# Patient Record
Sex: Female | Born: 1990 | Race: White | Hispanic: No | Marital: Single | Smoking: Former smoker
Health system: Southern US, Community
[De-identification: ages and names within clinical notes are randomized; demographics above are authoritative.]

## PROBLEM LIST (undated history)

## (undated) DIAGNOSIS — J020 Streptococcal pharyngitis: Secondary | ICD-10-CM

---
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
## 2014-10-16 ENCOUNTER — Encounter: Payer: Self-pay | Admitting: Intensive Care

## 2014-10-16 ENCOUNTER — Emergency Department
Admission: EM | Admit: 2014-10-16 | Discharge: 2014-10-16 | Disposition: A | Discharge status: Home / Self Care | Payer: Medicaid - Out of State | Attending: Emergency Medicine | Admitting: Emergency Medicine

## 2014-10-16 DIAGNOSIS — J029 Acute pharyngitis, unspecified: Secondary | ICD-10-CM | POA: Diagnosis present

## 2014-10-16 DIAGNOSIS — Z720 Tobacco use: Secondary | ICD-10-CM | POA: Insufficient documentation

## 2014-10-16 DIAGNOSIS — J039 Acute tonsillitis, unspecified: Secondary | ICD-10-CM | POA: Insufficient documentation

## 2014-10-16 HISTORY — DX: Streptococcal pharyngitis: J02.0

## 2014-10-16 LAB — POCT RAPID STREP A: STREPTOCOCCUS, GROUP A SCREEN (DIRECT): NEGATIVE

## 2014-10-16 MED ORDER — AMOXICILLIN 500 MG PO CAPS: 500.0000 mg | ORAL_CAPSULE | Freq: Three times a day (TID) | ORAL | Status: DC

## 2014-10-16 NOTE — ED Notes (Signed)
Pt c/o earache and sore throat with white spots in back of throat X 3 days. Pt reports having strep throat before and feels like it is strep throat.

## 2014-10-16 NOTE — ED Provider Notes (Signed)
[facility] Emergency Department Provider Note ____________________________________________  Time seen: 1605  I have reviewed the triage vital signs and the nursing notes.  HISTORY  Chief Complaint  Sore Throat  HPI Eileen Carpenter is a 24 y.o. female ED with 3 days complaint of earache and sore throat. She didn't describe seeing spots in the back of her throat, and describes it feels like strep that she's had before.  Past Medical History  Diagnosis Date  . Strep throat    There are no active problems to display for this patient.  History reviewed. No pertinent past surgical history.  Current Outpatient Rx  Name  Route  Sig  Dispense  Refill  . amoxicillin (AMOXIL) 500 MG capsule   Oral   Take 1 capsule (500 mg total) by mouth 3 (three) times daily.   30 capsule   0    Allergies Motrin  History reviewed. No pertinent family history.  Social History History  Substance Use Topics  . Smoking status: Current Every Day Smoker -- 0.50 packs/day    Types: Cigarettes  . Smokeless tobacco: Never Used  . Alcohol Use: No   Review of Systems  Constitutional: Negative for fever. Eyes: Negative for visual changes. ENT: Positive for sore throat. Cardiovascular: Negative for chest pain. Respiratory: Negative for shortness of breath. Gastrointestinal: Negative for abdominal pain, vomiting and diarrhea. Genitourinary: Negative for dysuria. Musculoskeletal: Negative for back pain. Skin: Negative for rash. Neurological: Negative for headaches, focal weakness or numbness. ____________________________________________  PHYSICAL EXAM:  VITAL SIGNS: ED Triage Vitals  Enc Vitals Group     BP 10/16/14 1616 117/95 mmHg     Pulse Rate 10/16/14 1616 114     Resp 10/16/14 1616 16     Temp 10/16/14 1616 99.9 F (37.7 C)     Temp Source 10/16/14 1616 Oral     SpO2 10/16/14 1616 99 %     Weight 10/16/14 1616 104 lb (47.174 kg)     Height 10/16/14 1616 5\' 1"   (1.549 m)     Head Cir --      Peak Flow --      Pain Score 10/16/14 1617 7     Pain Loc --      Pain Edu? --      Excl. in GC? --    Constitutional: Alert and oriented. Well appearing and in no distress. Eyes: Conjunctivae are normal. PERRL. Normal extraocular movements. ENT   Head: Normocephalic and atraumatic.   Nose: No congestion/rhinnorhea.   Mouth/Throat: Mucous membranes are moist. Tonsils enlarged, injected, with exudate noted.   Neck: Supple. No thyromegaly. Hematological/Lymphatic/Immunilogical: Anterior, upper cervical lymphadenopathy. Cardiovascular: Normal rate, regular rhythm.  Respiratory: Normal respiratory effort. No wheezes/rales/rhonchi. Gastrointestinal: Soft and nontender. No distention. Musculoskeletal: Nontender with normal range of motion in all extremities.  Neurologic:  Normal gait without ataxia. Normal speech and language. No gross focal neurologic deficits are appreciated. Skin:  Skin is warm, dry and intact. No rash noted. Psychiatric: Mood and affect are normal. Patient exhibits appropriate insight and judgment. ____________________________________________   LABS (pertinent positives/negatives) Rapid Strep - negative Throat Culture - pending ____________________________________________  INITIAL IMPRESSION / ASSESSMENT AND PLAN / ED COURSE  Acute tonsillitis. Treatment with amoxicillin. Follow-up with primary provider as needed.  ____________________________________________  FINAL CLINICAL IMPRESSION(S) / ED DIAGNOSES  Final diagnoses:  Acute tonsillitis     Lissa HoardJenise V Bacon Usama Harkless, PA-C 10/16/14 1757  Arnaldo NatalPaul F Malinda, MD 10/17/14 [phone]

## 2014-10-16 NOTE — Discharge Instructions (Signed)
Tonsillitis Tonsillitis is an infection of the throat that causes the tonsils to become red, tender, and swollen. Tonsils are collections of lymphoid tissue at the back of the throat. Each tonsil has crevices (crypts). Tonsils help fight nose and throat infections and keep infection from spreading to other parts of the body for the first 18 months of life.  CAUSES Sudden (acute) tonsillitis is usually caused by infection with streptococcal bacteria. Long-lasting (chronic) tonsillitis occurs when the crypts of the tonsils become filled with pieces of food and bacteria, which makes it easy for the tonsils to become repeatedly infected. SYMPTOMS  Symptoms of tonsillitis include:  A sore throat, with possible difficulty swallowing.  White patches on the tonsils.  Fever.  Tiredness.  New episodes of snoring during sleep, when you did not snore before.  Small, foul-smelling, yellowish-white pieces of material (tonsilloliths) that you occasionally cough up or spit out. The tonsilloliths can also cause you to have bad breath. DIAGNOSIS Tonsillitis can be diagnosed through a physical exam. Diagnosis can be confirmed with the results of lab tests, including a throat culture. TREATMENT  The goals of tonsillitis treatment include the reduction of the severity and duration of symptoms and prevention of associated conditions. Symptoms of tonsillitis can be improved with the use of steroids to reduce the swelling. Tonsillitis caused by bacteria can be treated with antibiotic medicines. Usually, treatment with antibiotic medicines is started before the cause of the tonsillitis is known. However, if it is determined that the cause is not bacterial, antibiotic medicines will not treat the tonsillitis. If attacks of tonsillitis are severe and frequent, your health care provider may recommend surgery to remove the tonsils (tonsillectomy). HOME CARE INSTRUCTIONS   Rest as much as possible and get plenty of  sleep.  Drink plenty of fluids. While the throat is very sore, eat soft foods or liquids, such as sherbet, soups, or instant breakfast drinks.  Eat frozen ice pops.  Gargle with a warm or cold liquid to help soothe the throat. Mix 1/4 teaspoon of salt and 1/4 teaspoon of baking soda in 8 oz of water. SEEK MEDICAL CARE IF:   Large, tender lumps develop in your neck.  A rash develops.  A green, yellow-brown, or bloody substance is coughed up.  You are unable to swallow liquids or food for 24 hours.  You notice that only one of the tonsils is swollen. SEEK IMMEDIATE MEDICAL CARE IF:   You develop any new symptoms such as vomiting, severe headache, stiff neck, chest pain, or trouble breathing or swallowing.  You have severe throat pain along with drooling or voice changes.  You have severe pain, unrelieved with recommended medications.  You are unable to fully open the mouth.  You develop redness, swelling, or severe pain anywhere in the neck.  You have a fever. MAKE SURE YOU:   Understand these instructions.  Will watch your condition.  Will get help right away if you are not doing well or get worse. Document Released: 01/09/2005 Document Revised: 08/16/2013 Document Reviewed: 09/18/2012 [facility] Patient Information 2015 [employer] [place]. This information is not intended to replace advice given to you by your health care provider. Make sure you discuss any questions you have with your health care provider.  Take the antibiotic as directed until completely gone.  [facility].  Mix and gargle equal parts of Benadryl (diphenhydramine) elixir with Maalox for throat pain relief, as needed. Take Tylenol as needed for pain relief.

## 2014-10-20 LAB — CULTURE, GROUP A STREP (THRC): Special Requests: NORMAL

## 2014-11-21 ENCOUNTER — Encounter: Payer: Self-pay | Admitting: Emergency Medicine

## 2014-11-21 ENCOUNTER — Emergency Department
Admission: EM | Admit: 2014-11-21 | Discharge: 2014-11-21 | Disposition: A | Discharge status: Home / Self Care | Payer: Medicaid - Out of State | Attending: Emergency Medicine | Admitting: Emergency Medicine

## 2014-11-21 DIAGNOSIS — N3090 Cystitis, unspecified without hematuria: Secondary | ICD-10-CM | POA: Insufficient documentation

## 2014-11-21 DIAGNOSIS — Z720 Tobacco use: Secondary | ICD-10-CM | POA: Insufficient documentation

## 2014-11-21 DIAGNOSIS — N390 Urinary tract infection, site not specified: Secondary | ICD-10-CM

## 2014-11-21 DIAGNOSIS — Z3202 Encounter for pregnancy test, result negative: Secondary | ICD-10-CM | POA: Diagnosis not present

## 2014-11-21 DIAGNOSIS — R1030 Lower abdominal pain, unspecified: Secondary | ICD-10-CM | POA: Diagnosis present

## 2014-11-21 LAB — COMPREHENSIVE METABOLIC PANEL
ALK PHOS: 72 U/L (ref 38–126)
ALT: 18 U/L (ref 14–54)
AST: 20 U/L (ref 15–41)
Albumin: 5 g/dL (ref 3.5–5.0)
Anion gap: 10 (ref 5–15)
BUN: 9 mg/dL (ref 6–20)
CALCIUM: 9.4 mg/dL (ref 8.9–10.3)
CHLORIDE: 103 mmol/L (ref 101–111)
CO2: 20 mmol/L — ABNORMAL LOW (ref 22–32)
Creatinine, Ser: 0.65 mg/dL (ref 0.44–1.00)
GFR calc Af Amer: >60 mL/min (ref >60)
GFR calc non Af Amer: >60 mL/min (ref >60)
Glucose, Bld: 106 mg/dL — ABNORMAL HIGH (ref 65–99)
Potassium: 3.6 mmol/L (ref 3.5–5.1)
Sodium: 133 mmol/L — ABNORMAL LOW (ref 135–145)
Total Bilirubin: 0.8 mg/dL (ref 0.3–1.2)
Total Protein: 8.5 g/dL — ABNORMAL HIGH (ref 6.5–8.1)

## 2014-11-21 LAB — URINALYSIS COMPLETE WITH MICROSCOPIC (ARMC ONLY)
Bilirubin Urine: NEGATIVE
Glucose, UA: NEGATIVE mg/dL
HGB URINE DIPSTICK: NEGATIVE
KETONES UR: NEGATIVE mg/dL
NITRITE: POSITIVE — AB
PROTEIN: NEGATIVE mg/dL
Specific Gravity, Urine: 1.021 (ref 1.005–1.030)
pH: 5 (ref 5.0–8.0)

## 2014-11-21 LAB — CBC
HCT: 44.9 % (ref 35.0–47.0)
Hemoglobin: 15.2 g/dL (ref 12.0–16.0)
MCH: 31.1 pg (ref 26.0–34.0)
MCHC: 33.8 g/dL (ref 32.0–36.0)
MCV: 92.2 fL (ref 80.0–100.0)
PLATELETS: 246 10*3/uL (ref 150–440)
RBC: 4.88 MIL/uL (ref 3.80–5.20)
RDW: 12.4 % (ref 11.5–14.5)
WBC: 10.5 10*3/uL (ref 3.6–11.0)

## 2014-11-21 LAB — WET PREP, GENITAL
Clue Cells Wet Prep HPF POC: NONE SEEN
Trich, Wet Prep: NONE SEEN
WBC, Wet Prep HPF POC: NONE SEEN
Yeast Wet Prep HPF POC: NONE SEEN

## 2014-11-21 LAB — CHLAMYDIA/NGC RT PCR (ARMC ONLY)
Chlamydia Tr: NOT DETECTED
N gonorrhoeae: NOT DETECTED

## 2014-11-21 LAB — POCT PREGNANCY, URINE: Preg Test, Ur: NEGATIVE

## 2014-11-21 LAB — HCG, QUANTITATIVE, PREGNANCY

## 2014-11-21 MED ORDER — PHENAZOPYRIDINE HCL 200 MG PO TABS
ORAL_TABLET | ORAL | Status: DC
Start: 2014-11-21 — End: 2014-11-21
  Filled 2014-11-21: qty 1

## 2014-11-21 MED ORDER — CIPROFLOXACIN HCL 500 MG PO TABS: 500.0000 mg | ORAL_TABLET | Freq: Two times a day (BID) | ORAL | Status: AC

## 2014-11-21 MED ORDER — CIPROFLOXACIN HCL 500 MG PO TABS
ORAL_TABLET | ORAL | Status: AC
  Filled 2014-11-21: qty 1

## 2014-11-21 MED ORDER — PHENAZOPYRIDINE HCL 200 MG PO TABS: 200.0000 mg | ORAL_TABLET | Freq: Three times a day (TID) | ORAL | Status: DC | PRN

## 2014-11-21 MED ORDER — CIPROFLOXACIN HCL 500 MG PO TABS
500.0000 mg | ORAL_TABLET | Freq: Once | ORAL | Status: AC
  Administered 2014-11-21: 500 mg via ORAL

## 2014-11-21 MED ORDER — PHENAZOPYRIDINE HCL 200 MG PO TABS
200.0000 mg | ORAL_TABLET | Freq: Once | ORAL | Status: AC
  Administered 2014-11-21: 200 mg via ORAL
  Filled 2014-11-21: qty 1

## 2014-11-21 NOTE — ED Notes (Signed)
C/o n,v intermittently since last Wednesday, states this am she woke up she "sharp pains to lower stomach", denies any vomiting today

## 2014-11-21 NOTE — ED Notes (Signed)
Pelvic exam done  specimen to lab. 

## 2014-11-21 NOTE — ED Provider Notes (Addendum)
[facility] Emergency Department Provider Note     Time seen: ----------------------------------------- 5:42 PM on 11/21/2014 -----------------------------------------    I have reviewed the triage vital signs and the nursing notes.   HISTORY  Chief Complaint Abdominal Pain and Emesis    HPI Eileen Carpenter is a 24 y.o. female who presents ER for nausea vomiting intermittently since last Wednesday. Patient states she woke up this morning with sharp pains in her lower stomach, denies any vomiting today. Does report unprotected sexual activity. Patient denies any fevers chills other complaints   Past Medical History  Diagnosis Date  . Strep throat     There are no active problems to display for this patient.   History reviewed. No pertinent past surgical history.  Allergies Motrin  Social History History  Substance Use Topics  . Smoking status: Current Every Day Smoker -- 0.50 packs/day    Types: Cigarettes  . Smokeless tobacco: Never Used  . Alcohol Use: Yes    Review of Systems Constitutional: Negative for fever. Eyes: Negative for visual changes. ENT: Negative for sore throat. Cardiovascular: Negative for chest pain. Respiratory: Negative for shortness of breath. Gastrointestinal: Positive for abdominal pain and vomiting Genitourinary: Negative for dysuria. Musculoskeletal: Negative for back pain. Skin: Negative for rash. Neurological: Negative for headaches, focal weakness or numbness.  10-point ROS otherwise negative.  ____________________________________________   PHYSICAL EXAM:  VITAL SIGNS: ED Triage Vitals  Enc Vitals Group     BP 11/21/14 1721 126/82 mmHg     Pulse Rate 11/21/14 1721 109     Resp 11/21/14 1721 18     Temp 11/21/14 1721 98.6 F (37 C)     Temp Source 11/21/14 1721 Oral     SpO2 11/21/14 1721 99 %     Weight 11/21/14 1721 107 lb (48.535 kg)     Height 11/21/14 1721  (1.549 m)     Head Cir --       Peak Flow --      Pain Score 11/21/14 1722 4     Pain Loc --      Pain Edu? --      Excl. in GC? --     Constitutional: Alert and oriented. Well appearing and in no distress. Eyes: Conjunctivae are normal. PERRL. Normal extraocular movements. ENT   Head: Normocephalic and atraumatic.   Nose: No congestion/rhinnorhea.   Mouth/Throat: Mucous membranes are moist.   Neck: No stridor. Cardiovascular: Normal rate, regular rhythm. Normal and symmetric distal pulses are present in all extremities. No murmurs, rubs, or gallops. Respiratory: Normal respiratory effort without tachypnea nor retractions. Breath sounds are clear and equal bilaterally. No wheezes/rales/rhonchi. Gastrointestinal: Positive for lower abdominal tenderness, no rebound or guarding. Normal bowel sounds. No CVA tenderness  Genitourinary: Pelvic exam is unremarkable Musculoskeletal: Nontender with normal range of motion in all extremities. No joint effusions.  No lower extremity tenderness nor edema. Neurologic:  Normal speech and language. No gross focal neurologic deficits are appreciated. Speech is normal. No gait instability. Skin:  Skin is warm, dry and intact. No rash noted. Psychiatric: Mood and affect are normal. Speech and behavior are normal. Patient exhibits appropriate insight and judgment.  ____________________________________________  ED COURSE:  Pertinent labs & imaging results that were available during my care of the patient were reviewed by me and considered in my medical decision making (see chart for details). Patient is in no acute distress, will need pelvic examination and urinalysis. ____________________________________________    LABS (pertinent  positives/negatives)  Labs Reviewed  COMPREHENSIVE METABOLIC PANEL - Abnormal; Notable for the following:    Sodium 133 (*)    CO2 20 (*)    Glucose, Bld 106 (*)    Total Protein 8.5 (*)    All other components within normal limits   URINALYSIS COMPLETEWITH MICROSCOPIC ([facility] ONLY) - Abnormal; Notable for the following:    Color, Urine YELLOW (*)    APPearance HAZY (*)    Nitrite POSITIVE (*)    Leukocytes, UA 2+ (*)    Bacteria, UA RARE (*)    Squamous Epithelial / LPF 0-5 (*)    All other components within normal limits  WET PREP, GENITAL  CHLAMYDIA/NGC RT PCR ([facility] ONLY)  CBC  HCG, QUANTITATIVE, PREGNANCY  POCT PREGNANCY, URINE   ____________________________________________  FINAL ASSESSMENT AND PLAN  Cystitis, pelvic pain  Plan: Patient with labs and imaging as dictated above. Patient is no acute distress, likely cystitis. Will be discharged with Pyridium and antibiotics. She is a fall 48 hours if no improvement.   Emily Filbert, MD   Emily Filbert, MD 11/21/14 1610  Emily Filbert, MD 11/21/14 [phone]

## 2014-11-21 NOTE — Discharge Instructions (Signed)

## 2015-04-14 ENCOUNTER — Emergency Department
Admission: EM | Admit: 2015-04-14 | Discharge: 2015-04-15 | Disposition: A | Discharge status: Home / Self Care | Payer: PRIVATE HEALTH INSURANCE | Attending: Emergency Medicine | Admitting: Emergency Medicine

## 2015-04-14 ENCOUNTER — Encounter: Payer: Self-pay | Admitting: Emergency Medicine

## 2015-04-14 DIAGNOSIS — O039 Complete or unspecified spontaneous abortion without complication: Secondary | ICD-10-CM | POA: Insufficient documentation

## 2015-04-14 DIAGNOSIS — Z792 Long term (current) use of antibiotics: Secondary | ICD-10-CM | POA: Insufficient documentation

## 2015-04-14 DIAGNOSIS — Z3A01 Less than 8 weeks gestation of pregnancy: Secondary | ICD-10-CM | POA: Insufficient documentation

## 2015-04-14 DIAGNOSIS — O200 Threatened abortion: Secondary | ICD-10-CM

## 2015-04-14 DIAGNOSIS — Z87891 Personal history of nicotine dependence: Secondary | ICD-10-CM | POA: Insufficient documentation

## 2015-04-14 LAB — CBC
HCT: 43 % (ref 35.0–47.0)
Hemoglobin: 14.4 g/dL (ref 12.0–16.0)
MCH: 30.2 pg (ref 26.0–34.0)
MCHC: 33.6 g/dL (ref 32.0–36.0)
MCV: 90 fL (ref 80.0–100.0)
PLATELETS: 250 10*3/uL (ref 150–440)
RBC: 4.78 MIL/uL (ref 3.80–5.20)
RDW: 12.2 % (ref 11.5–14.5)
WBC: 11.7 10*3/uL — ABNORMAL HIGH (ref 3.6–11.0)

## 2015-04-14 LAB — POCT PREGNANCY, URINE: PREG TEST UR: POSITIVE — AB

## 2015-04-14 LAB — HCG, QUANTITATIVE, PREGNANCY: hCG, Beta Chain, Quant, S: 43699 m[IU]/mL — ABNORMAL HIGH (ref <5)

## 2015-04-14 NOTE — ED Notes (Signed)
Per pt she has been spotting throughout pregnancy but today when she wiped she found a clot. Pt denies bleeding today at this time. Pt is a/o with NAD noted.

## 2015-04-15 ENCOUNTER — Emergency Department: Payer: PRIVATE HEALTH INSURANCE

## 2015-04-15 LAB — TYPE AND SCREEN
ABO/RH(D): A POS
Antibody Screen: NEGATIVE

## 2015-04-15 LAB — ABO/RH: ABO/RH(D): A POS

## 2015-04-15 NOTE — ED Notes (Signed)
Pt returned from [facility] at this time.

## 2015-04-15 NOTE — Discharge Instructions (Signed)
Threatened Miscarriage A threatened miscarriage occurs when you have vaginal bleeding during your first 20 weeks of pregnancy but the pregnancy has not ended. If you have vaginal bleeding during this time, your health care provider will do tests to make sure you are still pregnant. If the tests show you are still pregnant and the developing baby (fetus) inside your womb (uterus) is still growing, your condition is considered a threatened miscarriage. A threatened miscarriage does not mean your pregnancy will end, but it does increase the risk of losing your pregnancy (complete miscarriage). CAUSES  The cause of a threatened miscarriage is usually not known. If you go on to have a complete miscarriage, the most common cause is an abnormal number of chromosomes in the developing baby. Chromosomes are the structures inside cells that hold all your genetic material. Some causes of vaginal bleeding that do not result in miscarriage include:  Having sex.  Having an infection.  Normal hormone changes of pregnancy.  Bleeding that occurs when an egg implants in your uterus. RISK FACTORS Risk factors for bleeding in early pregnancy include:  Obesity.  Smoking.  Drinking excessive amounts of alcohol or caffeine.  Recreational drug use. SIGNS AND SYMPTOMS  Light vaginal bleeding.  Mild abdominal pain or cramps. DIAGNOSIS  If you have bleeding with or without abdominal pain before 20 weeks of pregnancy, your health care provider will do tests to check whether you are still pregnant. One important test involves using sound waves and a computer (ultrasound) to create images of the inside of your uterus. Other tests include an internal exam of your vagina and uterus (pelvic exam) and measurement of your baby's heart rate.  You may be diagnosed with a threatened miscarriage if:  Ultrasound testing shows you are still pregnant.  Your baby's heart rate is strong.  A pelvic exam shows that the  opening between your uterus and your vagina (cervix) is closed.  Your heart rate and blood pressure are stable.  Blood tests confirm you are still pregnant. TREATMENT  No treatments have been shown to prevent a threatened miscarriage from going on to a complete miscarriage. However, the right home care is important.  HOME CARE INSTRUCTIONS   Make sure you keep all your appointments for prenatal care. This is very important.  Get plenty of rest.  Do not have sex or use tampons if you have vaginal bleeding.  Do not douche.  Do not smoke or use recreational drugs.  Do not drink alcohol.  Avoid caffeine. SEEK MEDICAL CARE IF:  You have light vaginal bleeding or spotting while pregnant.  You have abdominal pain or cramping.  You have a fever. SEEK IMMEDIATE MEDICAL CARE IF:  You have heavy vaginal bleeding.  You have blood clots coming from your vagina.  You have severe low back pain or abdominal cramps.  You have fever, chills, and severe abdominal pain. MAKE SURE YOU:  Understand these instructions.  Will watch your condition.  Will get help right away if you are not doing well or get worse.   This information is not intended to replace advice given to you by your health care provider. Make sure you discuss any questions you have with your health care provider.   Document Released: 04/01/2005 Document Revised: 04/06/2013 Document Reviewed: 01/26/2013 Elsevier Interactive Patient Education [employer]  Please return Sunday for the repeat blood test. Call up [facility] side Monday morning tell them you were in the emergency room with a threatened miscarriage and  had blood work repeated on the day before that is the first. They should see you very quickly. Please return for bad cramps heavy bleeding lightheadedness fever or any other complaints.

## 2015-04-15 NOTE — ED Notes (Signed)
Pt. Going home with significant other, will follow up with Jean RosenthalJackson, MD in the next couple days.

## 2015-04-15 NOTE — ED Provider Notes (Signed)
[facility] Emergency Department Provider Note  ____________________________________________  Time seen: Approximately 7:32 AM  I have reviewed the triage vital signs and the nursing notes.   HISTORY  Chief Complaint Vaginal Bleeding    HPI Eileen Carpenter is a 24 y.o. female who reports she's been spotting small amounts for the last several days. Today however she had a clot the size of her fingernail come out she got nervous and came to the emergency room. She denies any weakness lightheadedness fever. She denies any continued bleeding. She has no pain or cramps.   Past Medical History  Diagnosis Date  . Strep throat     There are no active problems to display for this patient.   History reviewed. No pertinent past surgical history.  Current Outpatient Rx  Name  Route  Sig  Dispense  Refill  . amoxicillin (AMOXIL) 500 MG capsule   Oral   Take 1 capsule (500 mg total) by mouth 3 (three) times daily.   30 capsule   0   . phenazopyridine (PYRIDIUM) 200 MG tablet   Oral   Take 1 tablet (200 mg total) by mouth 3 (three) times daily as needed for pain.   20 tablet   0     Allergies Motrin  No family history on file.  Social History Social History  Substance Use Topics  . Smoking status: Former Smoker -- 0.50 packs/day    Types: Cigarettes  . Smokeless tobacco: Never Used  . Alcohol Use: Yes    Review of Systems Constitutional: No fever/chills Eyes: No visual changes. ENT: No sore throat. Cardiovascular: Denies chest pain. Respiratory: Denies shortness of breath. Gastrointestinal: No abdominal pain.  No nausea, no vomiting.  No diarrhea.  No constipation. Genitourinary: Negative for dysuria. Musculoskeletal: Negative for back pain. Skin: Negative for rash. Neurological: Negative for headaches, focal weakness or numbness.  10-point ROS otherwise negative.  ____________________________________________   PHYSICAL EXAM:  VITAL  SIGNS: ED Triage Vitals  Enc Vitals Group     BP 04/14/15 2113 130/73 mmHg     Pulse Rate 04/14/15 2113 115     Resp 04/14/15 2113 14     Temp 04/14/15 2113 98.9 F (37.2 C)     Temp Source 04/14/15 2113 Oral     SpO2 04/14/15 2113 99 %     Weight 04/14/15 2113 127 lb (57.607 kg)     Height 04/14/15 2113  (1.549 m)     Head Cir --      Peak Flow --      Pain Score 04/14/15 2117 2     Pain Loc --      Pain Edu? --      Excl. in GC? --     Constitutional: Alert and oriented. Well appearing and in no acute distress. Eyes: Conjunctivae are normal. PERRL. EOMI. Head: Atraumatic. Nose: No congestion/rhinnorhea. Mouth/Throat: Mucous membranes are moist.  Oropharynx non-erythematous. Neck: No stridor.  Cardiovascular: Normal rate, regular rhythm. Grossly normal heart sounds.  Good peripheral circulation. Respiratory: Normal respiratory effort.  No retractions. Lungs CTAB. Gastrointestinal: Soft and nontender. No distention. No abdominal bruits. No CVA tenderness. Genitourinary: Patient reports bleeding has stopped. Patient offered pelvic exam but declines at this time I told the patient we can since she is not bleeding and has no pain can avoid the pelvic presently but she will have to have one when she follows up with the [facility] doctor in a couple days. Musculoskeletal: No lower extremity  tenderness nor edema.  No joint effusions. Neurologic:  Normal speech and language. No gross focal neurologic deficits are appreciated. No gait instability. Skin:  Skin is warm, dry and intact. No rash noted. Psychiatric: Mood and affect are normal. Speech and behavior are normal.  ____________________________________________   LABS (all labs ordered are listed, but only abnormal results are displayed)  Labs Reviewed  HCG, QUANTITATIVE, PREGNANCY - Abnormal; Notable for the following:    hCG, Beta Chain, Quant, [place] [id number] (*)    All other components within normal limits  CBC - Abnormal; Notable  for the following:    WBC 11.7 (*)    All other components within normal limits  POCT PREGNANCY, URINE - Abnormal; Notable for the following:    Preg Test, Ur POSITIVE (*)    All other components within normal limits  POC URINE PREG, ED  TYPE AND SCREEN  ABO/RH   ____________________________________________  EKG   ____________________________________________  RADIOLOGY ultrasound interpreted by radiology shows normal intrauterine pregnancy at 6 weeks. In reason for bleeding sounds ____________________________________________   PROCEDURES    ____________________________________________   INITIAL IMPRESSION / ASSESSMENT AND PLAN / ED COURSE  Pertinent labs & imaging results that were available during my care of the patient were reviewed by me and considered in my medical decision making (see chart for details).   ____________________________________________   FINAL CLINICAL IMPRESSION(S) / ED DIAGNOSES  Final diagnoses:  Threatened miscarriage      Arnaldo NatalPaul F Oluwatomisin Hustead, MD 04/15/15 [phone]

## 2015-04-15 NOTE — ED Notes (Signed)
MD Malinda at bedside. 

## 2015-04-15 NOTE — ED Notes (Signed)
Pt transported to [facility] at this time. 

## 2015-04-15 NOTE — ED Notes (Signed)
US called again, states will hopefully be able to pick pt up shortly.

## 2015-05-24 LAB — OB RESULTS CONSOLE VARICELLA ZOSTER ANTIBODY, IGG: Varicella: IMMUNE

## 2015-05-24 LAB — OB RESULTS CONSOLE RPR: RPR: NONREACTIVE

## 2015-05-24 LAB — OB RESULTS CONSOLE HEPATITIS B SURFACE ANTIGEN: HEP B S AG: NEGATIVE

## 2015-05-24 LAB — OB RESULTS CONSOLE HIV ANTIBODY (ROUTINE TESTING): HIV: NONREACTIVE

## 2015-05-24 LAB — OB RESULTS CONSOLE RUBELLA ANTIBODY, IGM: RUBELLA: IMMUNE

## 2015-11-13 ENCOUNTER — Encounter: Payer: Self-pay | Admitting: *Deleted

## 2015-11-13 ENCOUNTER — Observation Stay
Admission: EM | Admit: 2015-11-13 | Discharge: 2015-11-13 | Disposition: A | Discharge status: Home / Self Care | Payer: Medicaid Other | Attending: Obstetrics and Gynecology | Admitting: Obstetrics and Gynecology

## 2015-11-13 DIAGNOSIS — Z3A36 36 weeks gestation of pregnancy: Secondary | ICD-10-CM | POA: Insufficient documentation

## 2015-11-13 DIAGNOSIS — R030 Elevated blood-pressure reading, without diagnosis of hypertension: Secondary | ICD-10-CM | POA: Diagnosis present

## 2015-11-13 DIAGNOSIS — O1403 Mild to moderate pre-eclampsia, third trimester: Secondary | ICD-10-CM | POA: Diagnosis not present

## 2015-11-13 DIAGNOSIS — IMO0001 Reserved for inherently not codable concepts without codable children: Secondary | ICD-10-CM | POA: Diagnosis present

## 2015-11-13 LAB — CBC WITH DIFFERENTIAL/PLATELET
BASOS ABS: 0 10*3/uL (ref 0–0.1)
BASOS PCT: 0 %
EOS ABS: 0.1 10*3/uL (ref 0–0.7)
EOS PCT: 1 %
HEMATOCRIT: 32.9 % — AB (ref 35.0–47.0)
Hemoglobin: 11 g/dL — ABNORMAL LOW (ref 12.0–16.0)
Lymphocytes Relative: 16 %
Lymphs Abs: 1.8 10*3/uL (ref 1.0–3.6)
MCH: 27.3 pg (ref 26.0–34.0)
MCHC: 33.4 g/dL (ref 32.0–36.0)
MCV: 81.6 fL (ref 80.0–100.0)
MONO ABS: 0.9 10*3/uL (ref 0.2–0.9)
MONOS PCT: 7 %
NEUTROS ABS: 8.8 10*3/uL — AB (ref 1.4–6.5)
Neutrophils Relative %: 76 %
PLATELETS: 155 10*3/uL (ref 150–440)
RBC: 4.03 MIL/uL (ref 3.80–5.20)
RDW: 14.5 % (ref 11.5–14.5)
WBC: 11.6 10*3/uL — ABNORMAL HIGH (ref 3.6–11.0)

## 2015-11-13 LAB — COMPREHENSIVE METABOLIC PANEL
ALBUMIN: 3 g/dL — AB (ref 3.5–5.0)
ALT: 12 U/L — ABNORMAL LOW (ref 14–54)
ANION GAP: 8 (ref 5–15)
AST: 21 U/L (ref 15–41)
Alkaline Phosphatase: 254 U/L — ABNORMAL HIGH (ref 38–126)
BILIRUBIN TOTAL: 0.5 mg/dL (ref 0.3–1.2)
BUN: 8 mg/dL (ref 6–20)
CHLORIDE: 108 mmol/L (ref 101–111)
CO2: 19 mmol/L — ABNORMAL LOW (ref 22–32)
Calcium: 8.9 mg/dL (ref 8.9–10.3)
Creatinine, Ser: 0.49 mg/dL (ref 0.44–1.00)
GFR calc Af Amer: >60 mL/min (ref >60)
GLUCOSE: 87 mg/dL (ref 65–99)
POTASSIUM: 3.9 mmol/L (ref 3.5–5.1)
Sodium: 135 mmol/L (ref 135–145)
TOTAL PROTEIN: 6.5 g/dL (ref 6.5–8.1)

## 2015-11-13 LAB — OB RESULTS CONSOLE GBS: STREP GROUP B AG: POSITIVE

## 2015-11-13 LAB — PROTEIN / CREATININE RATIO, URINE
CREATININE, URINE: 359 mg/dL
PROTEIN CREATININE RATIO: 0.32 mg/mg{creat} — AB (ref 0.00–0.15)
TOTAL PROTEIN, URINE: 115 mg/dL

## 2015-11-13 MED ORDER — PRENATAL MULTIVITAMIN CH
1.0000 | ORAL_TABLET | Freq: Every day | ORAL | Status: DC
Start: 2015-11-14 — End: 2015-11-13

## 2015-11-13 MED ORDER — DOCUSATE SODIUM 100 MG PO CAPS: 100.0000 mg | ORAL_CAPSULE | Freq: Every day | ORAL | Status: DC

## 2015-11-13 MED ORDER — ZOLPIDEM TARTRATE 5 MG PO TABS: 5.0000 mg | ORAL_TABLET | Freq: Every evening | ORAL | Status: DC | PRN

## 2015-11-13 MED ORDER — ACETAMINOPHEN 325 MG PO TABS: 650.0000 mg | ORAL_TABLET | ORAL | Status: DC | PRN

## 2015-11-13 MED ORDER — CALCIUM CARBONATE ANTACID 500 MG PO CHEW: 2.0000 | CHEWABLE_TABLET | ORAL | Status: DC | PRN

## 2015-11-13 NOTE — Plan of Care (Signed)
Discharge instructions, both oral and written, given to pt and family. Pt to return Thursday at 8 pm for induction of labor per Dr. Bonney Aid order. Pt agrees with plan of care. Leaving dept with family in stable condition. Eileen Carpenter RNC

## 2015-11-13 NOTE — Discharge Summary (Signed)
Physician Discharge Summary  Patient ID: Eileen Carpenter MRN: [id number] DOB/AGE: [birth year] 23 y.o.  Admit date: 11/13/2015 Discharge date: 11/13/2015  Admission Diagnoses: Pt is G1P0 at 26w4dsent from clinic with elevated in mild range BP and 1+ protein on urine dip for PIH workup.   Discharge Diagnoses:  Active Problems:   Elevated blood pressure Mild preeclampsia, no severe features, Reactive NST  Discharged Condition: good  Hospital Course: Pt admitted for observation, put on monitors and labs sent for evaluation. Case discussed with Dr SGeorgianne Fickwho suggests bringing her back to hospital for induction at 37 weeks in 3 days.  Consults: None  Significant Diagnostic Studies: labs:   Results for HDINESHA, TWIGGS(MRN [id number] as of 11/13/2015 18:57  Ref. Range 11/13/2015 17:03 11/13/2015 17:41  Sodium Latest Ref Range: 135 - 145 mmol/L  135  Potassium Latest Ref Range: 3.5 - 5.1 mmol/L  3.9  Chloride Latest Ref Range: 101 - 111 mmol/L  108  CO2 Latest Ref Range: 22 - 32 mmol/L  19 (L)  BUN Latest Ref Range: 6 - 20 mg/dL  8  Creatinine Latest Ref Range: 0.44 - 1.00 mg/dL  0.49  Calcium Latest Ref Range: 8.9 - 10.3 mg/dL  8.9  EGFR (Non-African Amer.) Latest Ref Range: >60 mL/min  >60  EGFR (African American) Latest Ref Range: >60 mL/min  >60  Glucose Latest Ref Range: 65 - 99 mg/dL  87  Anion gap Latest Ref Range: 5 - 15   8  Alkaline Phosphatase Latest Ref Range: 38 - 126 U/L  254 (H)  Albumin Latest Ref Range: 3.5 - 5.0 g/dL  3.0 (L)  AST Latest Ref Range: 15 - 41 U/L  21  ALT Latest Ref Range: 14 - 54 U/L  12 (L)  Total Protein Latest Ref Range: 6.5 - 8.1 g/dL  6.5  Total Bilirubin Latest Ref Range: 0.3 - 1.2 mg/dL  0.5  WBC Latest Ref Range: 3.6 - 11.0 K/uL  11.6 (H)  RBC Latest Ref Range: 3.80 - 5.20 MIL/uL  4.03  Hemoglobin Latest Ref Range: 12.0 - 16.0 g/dL  11.0 (L)  HCT Latest Ref Range: 35.0 - 47.0 %  32.9 (L)  MCV Latest Ref Range: 80.0 - 100.0 fL  81.6  MCH Latest  Ref Range: 26.0 - 34.0 pg  27.3  MCHC Latest Ref Range: 32.0 - 36.0 g/dL  33.4  RDW Latest Ref Range: 11.5 - 14.5 %  14.5  Platelets Latest Ref Range: 150 - 440 K/uL  155  Neutrophils Latest Units: %  76  Lymphocytes Latest Units: %  16  Monocytes Relative Latest Units: %  7  Eosinophil Latest Units: %  1  Basophil Latest Units: %  0  NEUT# Latest Ref Range: 1.4 - 6.5 K/uL  8.8 (H)  Lymphocyte # Latest Ref Range: 1.0 - 3.6 K/uL  1.8  Monocyte # Latest Ref Range: 0.2 - 0.9 K/uL  0.9  Eosinophils Absolute Latest Ref Range: 0 - 0.7 K/uL  0.1  Basophils Absolute Latest Ref Range: 0 - 0.1 K/uL  0.0  Total Protein, Urine Latest Units: mg/dL 115   Protein Creatinine Ratio Latest Ref Range: 0.00 - 0.15 mg/mgCre 0.32 (H)   Creatinine, Urine Latest Units: mg/dL 359     Treatments: none  Discharge Exam: Last menstrual period 03/02/2015. General appearance: alert, cooperative, appears stated age and no distress Resp: clear to auscultation bilaterally Cardio: regular rate and rhythm  Cervix: deferred Toco: occasional Fetal Well Being: 145 bpm, moderate  variability, +accelerations, -decelerations  Disposition: 01-Home or Self Care  Return to [facility] 8/3 at 8pm for IOL for preeclampsia  Discharge Instructions    Discharge activity:  No Restrictions    Complete by:  As directed   Discharge diet:  No restrictions    Complete by:  As directed   Fetal Kick Count:  Lie on our left side for one hour after a meal, and count the number of times your baby kicks.  If it is less than 5 times, get up, move around and drink some juice.  Repeat the test 30 minutes later.  If it is still less than 5 kicks in an hour, notify your doctor.    Complete by:  As directed   No sexual activity restrictions    Complete by:  As directed   Notify physician for a general feeling that "something is not right"    Complete by:  As directed   Notify physician for increase or change in vaginal discharge    Complete by:  As  directed   Notify physician for intestinal cramps, with or without diarrhea, sometimes described as "gas pain"    Complete by:  As directed   Notify physician for leaking of fluid    Complete by:  As directed   Notify physician for low, dull backache, unrelieved by heat or Tylenol    Complete by:  As directed   Notify physician for menstrual like cramps    Complete by:  As directed   Notify physician for pelvic pressure    Complete by:  As directed   Notify physician for uterine contractions.  These may be painless and feel like the uterus is tightening or the baby is  "balling up"    Complete by:  As directed   Notify physician for vaginal bleeding    Complete by:  As directed   PRETERM LABOR:  Includes any of the follwing symptoms that occur between 20 - [redacted] weeks gestation.  If these symptoms are not stopped, preterm labor can result in preterm delivery, placing your baby at risk    Complete by:  As directed       Medication List    STOP taking these medications   amoxicillin 500 MG capsule Commonly known as:  AMOXIL   phenazopyridine 200 MG tablet Commonly known as:  PYRIDIUM        SignedRod Can, CNM

## 2015-11-13 NOTE — OB Triage Note (Signed)
G1 P0 arrived to [facility] from [place] from [facility] for evaluation for PIH. Pt has had swelling, n/v for two weeks. Occas spots before her eyes with nausea.  Denies epigastric pain at this time.  Elevgated bp today in office.  Protein in urine.  Ellison Carwin RNC

## 2015-11-13 NOTE — Discharge Instructions (Signed)
Pt to return Thursday evening at 8 pm for induction of labor. Pt agrees with plan of care. Come to [facility] for your induction. Ellison Carwin RNC

## 2015-11-16 ENCOUNTER — Inpatient Hospital Stay
Admission: AD | Admit: 2015-11-16 | Discharge: 2015-11-21 | DRG: 765 | Disposition: A | Discharge status: Home / Self Care | Payer: Medicaid Other | Source: Ambulatory Visit | Attending: Obstetrics and Gynecology | Admitting: Obstetrics and Gynecology

## 2015-11-16 DIAGNOSIS — D62 Acute posthemorrhagic anemia: Secondary | ICD-10-CM | POA: Diagnosis not present

## 2015-11-16 DIAGNOSIS — Z3A37 37 weeks gestation of pregnancy: Secondary | ICD-10-CM

## 2015-11-16 DIAGNOSIS — O1494 Unspecified pre-eclampsia, complicating childbirth: Secondary | ICD-10-CM | POA: Diagnosis present

## 2015-11-16 DIAGNOSIS — O99824 Streptococcus B carrier state complicating childbirth: Secondary | ICD-10-CM | POA: Diagnosis present

## 2015-11-16 DIAGNOSIS — Z23 Encounter for immunization: Secondary | ICD-10-CM

## 2015-11-16 DIAGNOSIS — O1404 Mild to moderate pre-eclampsia, complicating childbirth: Secondary | ICD-10-CM | POA: Diagnosis present

## 2015-11-16 DIAGNOSIS — Z98891 History of uterine scar from previous surgery: Secondary | ICD-10-CM

## 2015-11-16 LAB — URINE DRUG SCREEN, QUALITATIVE (ARMC ONLY)
AMPHETAMINES, UR SCREEN: NOT DETECTED
Barbiturates, Ur Screen: NOT DETECTED
Benzodiazepine, Ur Scrn: NOT DETECTED
Cannabinoid 50 Ng, Ur ~~LOC~~: NOT DETECTED
Cocaine Metabolite,Ur ~~LOC~~: NOT DETECTED
MDMA (ECSTASY) UR SCREEN: NOT DETECTED
METHADONE SCREEN, URINE: NOT DETECTED
Opiate, Ur Screen: NOT DETECTED
PHENCYCLIDINE (PCP) UR S: NOT DETECTED
Tricyclic, Ur Screen: NOT DETECTED

## 2015-11-16 LAB — COMPREHENSIVE METABOLIC PANEL
ALBUMIN: 2.7 g/dL — AB (ref 3.5–5.0)
ALT: 10 U/L — ABNORMAL LOW (ref 14–54)
ANION GAP: 8 (ref 5–15)
AST: 19 U/L (ref 15–41)
Alkaline Phosphatase: 254 U/L — ABNORMAL HIGH (ref 38–126)
BILIRUBIN TOTAL: 0.4 mg/dL (ref 0.3–1.2)
BUN: 9 mg/dL (ref 6–20)
CO2: 20 mmol/L — ABNORMAL LOW (ref 22–32)
Calcium: 9 mg/dL (ref 8.9–10.3)
Chloride: 109 mmol/L (ref 101–111)
Creatinine, Ser: 0.31 mg/dL — ABNORMAL LOW (ref 0.44–1.00)
GFR calc non Af Amer: >60 mL/min (ref >60)
GLUCOSE: 93 mg/dL (ref 65–99)
POTASSIUM: 3.9 mmol/L (ref 3.5–5.1)
SODIUM: 137 mmol/L (ref 135–145)
TOTAL PROTEIN: 6.1 g/dL — AB (ref 6.5–8.1)

## 2015-11-16 LAB — CBC
HCT: 31.5 % — ABNORMAL LOW (ref 35.0–47.0)
HEMOGLOBIN: 10.6 g/dL — AB (ref 12.0–16.0)
MCH: 27.4 pg (ref 26.0–34.0)
MCHC: 33.8 g/dL (ref 32.0–36.0)
MCV: 81.1 fL (ref 80.0–100.0)
Platelets: 156 10*3/uL (ref 150–440)
RBC: 3.88 MIL/uL (ref 3.80–5.20)
RDW: 14.6 % — ABNORMAL HIGH (ref 11.5–14.5)
WBC: 11.6 10*3/uL — ABNORMAL HIGH (ref 3.6–11.0)

## 2015-11-16 LAB — PROTEIN / CREATININE RATIO, URINE
CREATININE, URINE: 78 mg/dL
Protein Creatinine Ratio: 0.94 mg/mg{Cre} — ABNORMAL HIGH (ref 0.00–0.15)
TOTAL PROTEIN, URINE: 73 mg/dL

## 2015-11-16 LAB — TYPE AND SCREEN
ABO/RH(D): A POS
Antibody Screen: NEGATIVE

## 2015-11-16 LAB — RAPID HIV SCREEN (HIV 1/2 AB+AG)
HIV 1/2 ANTIBODIES: NONREACTIVE
HIV-1 P24 ANTIGEN - HIV24: NONREACTIVE

## 2015-11-16 MED ORDER — TERBUTALINE SULFATE 1 MG/ML IJ SOLN: 0.2500 mg | Freq: Once | INTRAMUSCULAR | Status: DC | PRN

## 2015-11-16 MED ORDER — LIDOCAINE HCL (PF) 1 % IJ SOLN
INTRAMUSCULAR | Status: AC
  Filled 2015-11-16: qty 30

## 2015-11-16 MED ORDER — DEXTROSE 5 % IV SOLN
2.5000 10*6.[IU] | INTRAVENOUS | Status: DC
  Administered 2015-11-17 – 2015-11-18 (×9): 2.5 10*6.[IU] via INTRAVENOUS
  Filled 2015-11-16 (×19): qty 2.5

## 2015-11-16 MED ORDER — AMMONIA AROMATIC IN INHA
RESPIRATORY_TRACT | Status: AC
  Filled 2015-11-16: qty 10

## 2015-11-16 MED ORDER — SOD CITRATE-CITRIC ACID 500-334 MG/5ML PO SOLN
30.0000 mL | ORAL | Status: DC | PRN
  Filled 2015-11-16: qty 15

## 2015-11-16 MED ORDER — MISOPROSTOL 200 MCG PO TABS
ORAL_TABLET | ORAL | Status: AC
  Filled 2015-11-16: qty 4

## 2015-11-16 MED ORDER — OXYTOCIN 40 UNITS IN LACTATED RINGERS INFUSION - SIMPLE MED
2.5000 [IU]/h | INTRAVENOUS | Status: DC
  Administered 2015-11-18: 850 [IU]/h via INTRAVENOUS
  Filled 2015-11-16: qty 1000

## 2015-11-16 MED ORDER — ACETAMINOPHEN 325 MG PO TABS: 650.0000 mg | ORAL_TABLET | ORAL | Status: DC | PRN

## 2015-11-16 MED ORDER — ZOLPIDEM TARTRATE 5 MG PO TABS
ORAL_TABLET | ORAL | Status: AC
Start: 2015-11-16 — End: 2015-11-16
  Administered 2015-11-16: 5 mg
  Filled 2015-11-16: qty 1

## 2015-11-16 MED ORDER — LACTATED RINGERS IV SOLN
INTRAVENOUS | Status: DC
  Administered 2015-11-16 – 2015-11-18 (×3): via INTRAVENOUS

## 2015-11-16 MED ORDER — LIDOCAINE HCL (PF) 1 % IJ SOLN: 30.0000 mL | INTRAMUSCULAR | Status: DC | PRN

## 2015-11-16 MED ORDER — LACTATED RINGERS IV SOLN: 500.0000 mL | INTRAVENOUS | Status: DC | PRN

## 2015-11-16 MED ORDER — OXYTOCIN 40 UNITS IN LACTATED RINGERS INFUSION - SIMPLE MED
INTRAVENOUS | Status: AC
  Filled 2015-11-16: qty 1000

## 2015-11-16 MED ORDER — OXYCODONE-ACETAMINOPHEN 5-325 MG PO TABS: 2.0000 | ORAL_TABLET | ORAL | Status: DC | PRN

## 2015-11-16 MED ORDER — PENICILLIN G POTASSIUM 5000000 UNITS IJ SOLR
5.0000 10*6.[IU] | Freq: Once | INTRAVENOUS | Status: AC
  Administered 2015-11-16: 5 10*6.[IU] via INTRAVENOUS
  Filled 2015-11-16: qty 5

## 2015-11-16 MED ORDER — ONDANSETRON HCL 4 MG/2ML IJ SOLN
4.0000 mg | Freq: Four times a day (QID) | INTRAMUSCULAR | Status: DC | PRN
  Administered 2015-11-18: 4 mg via INTRAVENOUS
  Filled 2015-11-16: qty 2

## 2015-11-16 MED ORDER — DINOPROSTONE 10 MG VA INST
10.0000 mg | VAGINAL_INSERT | Freq: Once | VAGINAL | Status: AC
Start: 2015-11-16 — End: 2015-11-16
  Administered 2015-11-16: 10 mg via VAGINAL
  Filled 2015-11-16: qty 1

## 2015-11-16 MED ORDER — OXYCODONE-ACETAMINOPHEN 5-325 MG PO TABS: 1.0000 | ORAL_TABLET | ORAL | Status: DC | PRN

## 2015-11-16 MED ORDER — OXYTOCIN BOLUS FROM INFUSION: 500.0000 mL | Freq: Once | INTRAVENOUS | Status: DC

## 2015-11-16 MED ORDER — OXYTOCIN 10 UNIT/ML IJ SOLN
INTRAMUSCULAR | Status: AC
  Filled 2015-11-16: qty 2

## 2015-11-16 NOTE — H&P (Signed)
Obstetric H&P   Chief Complaint: Induction  Prenatal Care Provider: [facility]  History of Present Illness: 26 y.o. G1P0 [redacted]w[redacted]d by 12/07/2015, presenting to L&D for induction of labor secondary to preeclampsia without severe features.  Seen an evaluated on 11/13/15 with mild range BP in clinic, continued during evaluation on L&D with reassuring fetal monitoring and P/C ratio of 0.32.  No headaches, vision changes, RUQ or epigastric pain, increased edema.  +FM, no LOF, no VB, no ctx.  ABO, Rh: --/--/A POS, A POS (12/31 0028)  Antibody: NEG (12/31 0028)  Rubella: Immune Varicella:Immune RPR: NR HBsAg: Negative HIV: Negative 1-hr: 147 with follow up 3-hr 98 / 173 / 144 / 99 GBS: Positive  30lbs weight gain this pregnancy, no other prenatal concerns.  Review of Systems: 10 point review of systems negative unless otherwise noted in HPI  Past Medical History: Past Medical History:  Diagnosis Date  . Strep throat     Past Surgical History: No past surgical history on file.  Past Obstetric History:  Past Gynecologic History:  Family History: No family history on file.  Social History: Social History   Social History  . Marital status: Single    Spouse name: N/A  . Number of children: N/A  . Years of education: N/A   Occupational History  . Not on file.   Social History Main Topics  . Smoking status: Former Smoker    Packs/day: 0.50    Types: Cigarettes  . Smokeless tobacco: Never Used  . Alcohol use Yes  . Drug use: No  . Sexual activity: Yes    Birth control/ protection: Injection   Other Topics Concern  . Not on file   Social History Narrative  . No narrative on file    Medications: Prior to Admission medications   Not on File    Allergies: Allergies  Allergen Reactions  . Motrin [Ibuprofen] Hives    Physical Exam: Vitals: Last menstrual period 03/02/2015.  Urine Dip Protein: P/C ratio pending  FHT: 145, moderate, +accels, no decels Toco:  irregular  General: NAD HEENT: normocephalic, anicteric Pulmonary: CTAB Cardiovascular: RRR Abdomen: Gravid, non-tender Leopolds: vtx Genitourinary: closed at last check Extremities: no edema  Labs: No results found for this or any previous visit (from the past 24 hour(s)).  Assessment: 25 y.o. G1P0 [redacted]w[redacted]d by 12/07/2015,IOL for preeclampsia without severe features  Plan: 1) Preeclampsia without severe features - repeat labs ordered.  Proceed with IOL  2) Fetus - cat I tracing  3) PNL - A pos / ABSC neg / RI / VZI / RPR NR / HIV neg / 1st trimester screen negative / elevated 1-hr 147 with 3-hr normal other than fasting of 98 / GBS positive - PCN for GBS ppx  4) TDAP - needs  5) Disposition - pending delivery

## 2015-11-17 LAB — CHLAMYDIA/NGC RT PCR (ARMC ONLY)
Chlamydia Tr: NOT DETECTED
N gonorrhoeae: NOT DETECTED

## 2015-11-17 LAB — OB RESULTS CONSOLE GC/CHLAMYDIA
Chlamydia: NEGATIVE
GC PROBE AMP, GENITAL: NEGATIVE

## 2015-11-17 LAB — PLATELET COUNT: PLATELETS: 149 10*3/uL — AB (ref 150–440)

## 2015-11-17 MED ORDER — SODIUM CHLORIDE 0.9 % IJ SOLN
INTRAMUSCULAR | Status: AC
  Filled 2015-11-17: qty 50

## 2015-11-17 MED ORDER — OXYTOCIN 40 UNITS IN LACTATED RINGERS INFUSION - SIMPLE MED
1.0000 m[IU]/min | INTRAVENOUS | Status: DC
  Administered 2015-11-17: 1 m[IU]/min via INTRAVENOUS
  Administered 2015-11-18: 20 m[IU]/min via INTRAVENOUS
  Filled 2015-11-17 (×2): qty 1000

## 2015-11-17 MED ORDER — BUTORPHANOL TARTRATE 1 MG/ML IJ SOLN
2.0000 mg | INTRAMUSCULAR | Status: DC | PRN
  Administered 2015-11-17 (×2): 1 mg via INTRAVENOUS
  Filled 2015-11-17 (×2): qty 1

## 2015-11-17 MED ORDER — TERBUTALINE SULFATE 1 MG/ML IJ SOLN
0.2500 mg | Freq: Once | INTRAMUSCULAR | Status: AC | PRN
  Administered 2015-11-18: 0.25 mg via SUBCUTANEOUS

## 2015-11-17 NOTE — Progress Notes (Signed)
L&D Note  11/17/2015 - 12:35 PM  25 y.o. G1P0 [redacted]w[redacted]d   [name] is admitted for IOL for pre-eclampsia without severe features   Subjective:  Feels some cramping. Cervidil taken out 2 hrs ago.  Objective:   Vitals:   11/17/15 0747 11/17/15 0854 11/17/15 0955 11/17/15 1130  BP: (!) 137/93 (!) 139/102 121/78 129/84  Pulse: (!) 103 (!) 102 89 (!) 107  Resp: 16     Temp: 98.6 F (37 C)     TempSrc: Oral     Weight:      Height:        Current Vital Signs 24h Vital Sign Ranges  T 98.6 F (37 C) Temp  Avg: 98.8 F (37.1 C)  Min: 98.4 F (36.9 C)  Max: 99.4 F (37.4 C)  BP 129/84 BP  Min: 107/64  Max: 143/96  HR (!) 107 Pulse  Avg: 100.3  Min: 87  Max: 117  RR 16 Resp  Avg: 19.6  Min: 16  Max: 24  SaO2     No Data Recorded       24 Hour I/O Current Shift I/O  Time Ins Outs 08/03 0701 - 08/04 0700 In: 1340 [P.O.:240; I.V.:750] Out: -  No intake/output data recorded.    FHR: cat 1, baseline 140, mod var, + accel, no decels Toco: q 2- 5 min SVE: 1.5/60/-2   Assessment :  IUP at [redacted]w[redacted]d, IOL for pre-e    Plan:  Foley bulb placed in cervix, balloon inflated with 30 cc of sterile saline, traction applied to leg.  Low dose pitocin ordered Anticipate vaginal delivery  Marta Antu, [place]

## 2015-11-17 NOTE — Progress Notes (Signed)
S: Foley bulb came out while up to bathroom. Pt reports only tightening, not severe  O: Cervix 6/90/-2, BP in mild range, FHR cat 1 tracing, ctx q 2 min  A: IUP at [redacted]w[redacted]d, IOL for pre-eclampsia without severe features  P: AROM with moderate meconium stained amniotic fluid

## 2015-11-18 ENCOUNTER — Inpatient Hospital Stay: Payer: Medicaid Other | Admitting: Anesthesiology

## 2015-11-18 ENCOUNTER — Encounter
Admission: AD | Disposition: A | Discharge status: Home / Self Care | Payer: Self-pay | Source: Ambulatory Visit | Attending: Obstetrics and Gynecology

## 2015-11-18 ENCOUNTER — Encounter: Payer: Self-pay | Admitting: Anesthesiology

## 2015-11-18 DIAGNOSIS — Z98891 History of uterine scar from previous surgery: Secondary | ICD-10-CM

## 2015-11-18 LAB — RPR: RPR: NONREACTIVE

## 2015-11-18 SURGERY — Surgical Case
Anesthesia: Epidural

## 2015-11-18 MED ORDER — LACTATED RINGERS IV SOLN: 500.0000 mL | Freq: Once | INTRAVENOUS | Status: DC

## 2015-11-18 MED ORDER — EPHEDRINE 5 MG/ML INJ: 10.0000 mg | INTRAVENOUS | Status: DC | PRN

## 2015-11-18 MED ORDER — NALBUPHINE HCL 10 MG/ML IJ SOLN
5.0000 mg | Freq: Once | INTRAMUSCULAR | Status: AC
  Administered 2015-11-18: 5 mg via INTRAVENOUS

## 2015-11-18 MED ORDER — OXYCODONE-ACETAMINOPHEN 5-325 MG PO TABS
1.0000 | ORAL_TABLET | ORAL | Status: DC | PRN
  Administered 2015-11-19: 2 via ORAL
  Administered 2015-11-19: 1 via ORAL
  Filled 2015-11-18: qty 1
  Filled 2015-11-18: qty 2

## 2015-11-18 MED ORDER — FENTANYL 2.5 MCG/ML W/ROPIVACAINE 0.2% IN NS 100 ML EPIDURAL INFUSION (ARMC-ANES)
EPIDURAL | Status: AC
  Administered 2015-11-18: 10 mL/h via EPIDURAL
  Filled 2015-11-18: qty 100

## 2015-11-18 MED ORDER — BUPIVACAINE 0.25 % ON-Q PUMP DUAL CATH 400 ML
INJECTION | Status: AC
  Filled 2015-11-18: qty 400

## 2015-11-18 MED ORDER — SIMETHICONE 80 MG PO CHEW
80.0000 mg | CHEWABLE_TABLET | Freq: Three times a day (TID) | ORAL | Status: DC
  Administered 2015-11-19 – 2015-11-21 (×5): 80 mg via ORAL
  Filled 2015-11-18 (×6): qty 1

## 2015-11-18 MED ORDER — HYDROMORPHONE HCL 1 MG/ML IJ SOLN
INTRAMUSCULAR | Status: DC | PRN
  Administered 2015-11-18: 0.5 mg via INTRAVENOUS

## 2015-11-18 MED ORDER — HYDROMORPHONE HCL 1 MG/ML IJ SOLN
INTRAMUSCULAR | Status: AC
  Filled 2015-11-18: qty 1

## 2015-11-18 MED ORDER — DIBUCAINE 1 % RE OINT
1.0000 "application " | TOPICAL_OINTMENT | RECTAL | Status: DC | PRN
  Administered 2015-11-18: 1 via RECTAL
  Filled 2015-11-18: qty 28

## 2015-11-18 MED ORDER — PHENYLEPHRINE 40 MCG/ML (10ML) SYRINGE FOR IV PUSH (FOR BLOOD PRESSURE SUPPORT)
80.0000 ug | PREFILLED_SYRINGE | INTRAVENOUS | Status: DC | PRN
Start: 2015-11-18 — End: 2015-11-18

## 2015-11-18 MED ORDER — OXYCODONE-ACETAMINOPHEN 5-325 MG PO TABS: 2.0000 | ORAL_TABLET | ORAL | Status: DC | PRN

## 2015-11-18 MED ORDER — DIPHENHYDRAMINE HCL 50 MG/ML IJ SOLN: 12.5000 mg | INTRAMUSCULAR | Status: DC | PRN

## 2015-11-18 MED ORDER — FENTANYL 2.5 MCG/ML W/ROPIVACAINE 0.2% IN NS 100 ML EPIDURAL INFUSION (ARMC-ANES)
EPIDURAL | Status: DC | PRN
  Administered 2015-11-18: 10 mL/h via EPIDURAL

## 2015-11-18 MED ORDER — NALBUPHINE HCL 10 MG/ML IJ SOLN
INTRAMUSCULAR | Status: AC
  Administered 2015-11-18: 5 mg via INTRAVENOUS
  Filled 2015-11-18: qty 1

## 2015-11-18 MED ORDER — LIDOCAINE HCL (CARDIAC) 20 MG/ML IV SOLN: INTRAVENOUS | Status: DC | PRN

## 2015-11-18 MED ORDER — CEFAZOLIN SODIUM-DEXTROSE 2-4 GM/100ML-% IV SOLN
INTRAVENOUS | Status: AC
  Administered 2015-11-18: 2 g via INTRAVENOUS
  Filled 2015-11-18: qty 100

## 2015-11-18 MED ORDER — FENTANYL CITRATE (PF) 100 MCG/2ML IJ SOLN
INTRAMUSCULAR | Status: DC | PRN
  Administered 2015-11-18: 50 ug via INTRAVENOUS
  Administered 2015-11-18: 100 ug via INTRAVENOUS
  Administered 2015-11-18: 100 ug via EPIDURAL

## 2015-11-18 MED ORDER — LIDOCAINE HCL (PF) 2 % IJ SOLN
INTRAMUSCULAR | Status: DC | PRN
  Administered 2015-11-18: 10 mL via INTRADERMAL
  Administered 2015-11-18 (×2): 2 mL via INTRADERMAL
  Administered 2015-11-18 (×2): 4 mL via INTRADERMAL

## 2015-11-18 MED ORDER — SENNOSIDES-DOCUSATE SODIUM 8.6-50 MG PO TABS
2.0000 | ORAL_TABLET | ORAL | Status: DC
  Administered 2015-11-20 – 2015-11-21 (×2): 2 via ORAL
  Filled 2015-11-18 (×2): qty 2

## 2015-11-18 MED ORDER — DEXTROSE 5 % IV SOLN
500.0000 mg | INTRAVENOUS | Status: AC
  Administered 2015-11-18: 500 mg via INTRAVENOUS
  Filled 2015-11-18 (×2): qty 500

## 2015-11-18 MED ORDER — LIDOCAINE-EPINEPHRINE (PF) 1.5 %-1:200000 IJ SOLN
INTRAMUSCULAR | Status: DC | PRN
  Administered 2015-11-18: 3 mL via PERINEURAL

## 2015-11-18 MED ORDER — BUPIVACAINE HCL 0.5 % IJ SOLN
INTRAMUSCULAR | Status: DC | PRN
  Administered 2015-11-18: 10 mL

## 2015-11-18 MED ORDER — WITCH HAZEL-GLYCERIN EX PADS
1.0000 "application " | MEDICATED_PAD | CUTANEOUS | Status: DC | PRN
  Administered 2015-11-18: 1 via TOPICAL
  Filled 2015-11-18: qty 100

## 2015-11-18 MED ORDER — OXYTOCIN 40 UNITS IN LACTATED RINGERS INFUSION - SIMPLE MED
2.5000 [IU]/h | INTRAVENOUS | Status: AC
  Filled 2015-11-18: qty 1000

## 2015-11-18 MED ORDER — BUPIVACAINE HCL (PF) 0.5 % IJ SOLN: 5.0000 mL | Freq: Once | INTRAMUSCULAR | Status: DC

## 2015-11-18 MED ORDER — DIPHENHYDRAMINE HCL 25 MG PO CAPS
25.0000 mg | ORAL_CAPSULE | Freq: Four times a day (QID) | ORAL | Status: DC | PRN
  Administered 2015-11-19: 25 mg via ORAL
  Filled 2015-11-18: qty 1

## 2015-11-18 MED ORDER — FENTANYL 2.5 MCG/ML W/ROPIVACAINE 0.2% IN NS 100 ML EPIDURAL INFUSION (ARMC-ANES)
10.0000 mL/h | EPIDURAL | Status: DC
  Administered 2015-11-18: 20 mL/h via EPIDURAL
  Administered 2015-11-18: 10 mL/h via EPIDURAL
  Filled 2015-11-18: qty 100

## 2015-11-18 MED ORDER — LACTATED RINGERS IV SOLN
INTRAVENOUS | Status: DC
  Administered 2015-11-19: 14:00:00 via INTRAVENOUS

## 2015-11-18 MED ORDER — LIDOCAINE HCL (PF) 1 % IJ SOLN
INTRAMUSCULAR | Status: DC | PRN
  Administered 2015-11-18: 3 mL

## 2015-11-18 MED ORDER — MORPHINE SULFATE (PF) 2 MG/ML IV SOLN
INTRAVENOUS | Status: AC
  Filled 2015-11-18: qty 1

## 2015-11-18 MED ORDER — TERBUTALINE SULFATE 1 MG/ML IJ SOLN
INTRAMUSCULAR | Status: AC
  Filled 2015-11-18: qty 1

## 2015-11-18 MED ORDER — OXYCODONE-ACETAMINOPHEN 5-325 MG PO TABS
ORAL_TABLET | ORAL | Status: AC
  Filled 2015-11-18: qty 1

## 2015-11-18 MED ORDER — PRENATAL MULTIVITAMIN CH
1.0000 | ORAL_TABLET | Freq: Every day | ORAL | Status: DC
  Administered 2015-11-20 – 2015-11-21 (×2): 1 via ORAL
  Filled 2015-11-18 (×2): qty 1

## 2015-11-18 MED ORDER — KETOROLAC TROMETHAMINE 30 MG/ML IJ SOLN
30.0000 mg | Freq: Four times a day (QID) | INTRAMUSCULAR | Status: AC
  Administered 2015-11-19 (×3): 30 mg via INTRAVENOUS
  Filled 2015-11-18 (×4): qty 1

## 2015-11-18 MED ORDER — OXYCODONE-ACETAMINOPHEN 5-325 MG PO TABS: 1.0000 | ORAL_TABLET | ORAL | Status: DC | PRN

## 2015-11-18 MED ORDER — CEFAZOLIN SODIUM-DEXTROSE 2-4 GM/100ML-% IV SOLN
2.0000 g | INTRAVENOUS | Status: AC
  Administered 2015-11-18: 2 g via INTRAVENOUS

## 2015-11-18 MED ORDER — FERROUS SULFATE 325 (65 FE) MG PO TABS
325.0000 mg | ORAL_TABLET | Freq: Two times a day (BID) | ORAL | Status: DC
  Administered 2015-11-19 – 2015-11-21 (×4): 325 mg via ORAL
  Filled 2015-11-18 (×4): qty 1

## 2015-11-18 MED ORDER — MENTHOL 3 MG MT LOZG
1.0000 | LOZENGE | OROMUCOSAL | Status: DC | PRN
  Filled 2015-11-18: qty 9

## 2015-11-18 MED ORDER — BUPIVACAINE HCL (PF) 0.5 % IJ SOLN
INTRAMUSCULAR | Status: AC
  Filled 2015-11-18: qty 30

## 2015-11-18 MED ORDER — BUPIVACAINE HCL (PF) 0.25 % IJ SOLN
INTRAMUSCULAR | Status: DC | PRN
  Administered 2015-11-18: 10 mL via EPIDURAL

## 2015-11-18 MED ORDER — MORPHINE SULFATE (PF) 2 MG/ML IV SOLN
2.0000 mg | Freq: Once | INTRAVENOUS | Status: AC
  Administered 2015-11-18: 2 mg via INTRAVENOUS

## 2015-11-18 MED ORDER — BUPIVACAINE 0.25 % ON-Q PUMP DUAL CATH 400 ML: 400.0000 mL | INJECTION | Status: DC

## 2015-11-18 MED ORDER — SOD CITRATE-CITRIC ACID 500-334 MG/5ML PO SOLN
30.0000 mL | ORAL | Status: AC
  Administered 2015-11-18: 30 mL via ORAL

## 2015-11-18 MED ORDER — COCONUT OIL OIL
1.0000 "application " | TOPICAL_OIL | Status: DC | PRN
  Administered 2015-11-19: 1 via TOPICAL
  Filled 2015-11-18: qty 120

## 2015-11-18 MED ORDER — BUPIVACAINE ON-Q PAIN PUMP (FOR ORDER SET NO CHG)
INJECTION | Status: DC
  Filled 2015-11-18: qty 1

## 2015-11-18 SURGICAL SUPPLY — 29 items
CANISTER SUCT 3000ML (MISCELLANEOUS) ×3 IMPLANT
CATH KIT ON-Q SILVERSOAK 5IN (CATHETERS) ×6 IMPLANT
CLOSURE WOUND 1/2 X4 (GAUZE/BANDAGES/DRESSINGS) ×1
DRSG OPSITE POSTOP 4X10 (GAUZE/BANDAGES/DRESSINGS) ×3 IMPLANT
DRSG TELFA 3X8 NADH (GAUZE/BANDAGES/DRESSINGS) IMPLANT
ELECT CAUTERY BLADE 6.4 (BLADE) ×3 IMPLANT
ELECT REM PT RETURN 9FT ADLT (ELECTROSURGICAL) ×3
ELECTRODE REM PT RTRN 9FT ADLT (ELECTROSURGICAL) ×1 IMPLANT
GAUZE SPONGE 4X4 12PLY STRL (GAUZE/BANDAGES/DRESSINGS) IMPLANT
GLOVE BIO SURGEON STRL SZ7 (GLOVE) ×15 IMPLANT
GLOVE INDICATOR 7.5 STRL GRN (GLOVE) ×3 IMPLANT
GOWN STRL REUS W/ TWL LRG LVL3 (GOWN DISPOSABLE) ×3 IMPLANT
GOWN STRL REUS W/TWL LRG LVL3 (GOWN DISPOSABLE) ×6
LIQUID BAND (GAUZE/BANDAGES/DRESSINGS) ×3 IMPLANT
NS IRRIG 1000ML POUR BTL (IV SOLUTION) ×3 IMPLANT
PACK C SECTION AR (MISCELLANEOUS) ×3 IMPLANT
PAD OB MATERNITY 4.3X12.25 (PERSONAL CARE ITEMS) ×3 IMPLANT
PAD PREP 24X41 OB/GYN DISP (PERSONAL CARE ITEMS) ×3 IMPLANT
SPONGE LAP 18X18 5 PK (GAUZE/BANDAGES/DRESSINGS) ×3 IMPLANT
STRIP CLOSURE SKIN 1/2X4 (GAUZE/BANDAGES/DRESSINGS) ×2 IMPLANT
SUT CHROMIC GUT BROWN 0 54 (SUTURE) ×1 IMPLANT
SUT CHROMIC GUT BROWN 0 54IN (SUTURE) ×3
SUT MNCRL 4-0 (SUTURE) ×2
SUT MNCRL 4-0 27XMFL (SUTURE) ×1
SUT PDS AB 1 TP1 96 (SUTURE) ×3 IMPLANT
SUT PLAIN 2 0 XLH (SUTURE) ×3 IMPLANT
SUT VIC AB 0 CT1 36 (SUTURE) ×12 IMPLANT
SUTURE MNCRL 4-0 27XMF (SUTURE) ×1 IMPLANT
SWABSTK COMLB BENZOIN TINCTURE (MISCELLANEOUS) IMPLANT

## 2015-11-18 NOTE — Progress Notes (Signed)
L&D Note  11/18/2015 - 7:46 AM  24 y.o. G1P0 [redacted]w[redacted]d   [name] is admitted for IOL for pre-eclampsia   Subjective:  Comfortable after epidural Objective:   Vitals:   11/18/15 0213 11/18/15 0215 11/18/15 0245 11/18/15 0458  BP: (!) 146/103   117/67  Pulse: (!) 111   97  Resp:      Temp:    99.1 F (37.3 C)  TempSrc:    Oral  SpO2: 100% 100% 96%   Weight:      Height:        Current Vital Signs 24h Vital Sign Ranges  T 99.1 F (37.3 C) Temp  Avg: 98.9 F (37.2 C)  Min: 98.5 F (36.9 C)  Max: 99.1 F (37.3 C)  BP 117/67 BP  Min: 117/67  Max: 157/108  HR 97 Pulse  Avg: 101.9  Min: 89  Max: 113  RR 18 Resp  Avg: 17  Min: 16  Max: 18  SaO2 96 % Not Delivered SpO2  Avg: 98.7 %  Min: 96 %  Max: 100 %       24 Hour I/O Current Shift I/O  Time Ins Outs 08/04 0701 - 08/05 0700 In: 2140 [I.V.:1840] Out: -  No intake/output data recorded.    FHR: category 1 tracing, baseline 140, mod variability, + accels Toco: q 2-3 min per toco SVE: 6/90/-1 to 0   Assessment :  IUP at [redacted]w[redacted]d, IOL for pre-e without severe features    Plan:  IUPC placed, titrate pitocin to achieve adquate MVUs.   Marta Antu, [place]

## 2015-11-18 NOTE — Anesthesia Preprocedure Evaluation (Signed)
Anesthesia Evaluation  Patient identified by MRN, date of birth, ID band Patient awake    Reviewed: Allergy & Precautions, NPO status , Patient's Chart, lab work & pertinent test results  Airway Mallampati: II       Dental no notable dental hx.    Pulmonary neg pulmonary ROS, former smoker,    Pulmonary exam normal        Cardiovascular hypertension, Normal cardiovascular exam  Pre-eclampsia   Neuro/Psych negative neurological ROS  negative psych ROS   GI/Hepatic   Endo/Other    Renal/GU   negative genitourinary   Musculoskeletal   Abdominal Normal abdominal exam  (+)   Peds negative pediatric ROS (+)  Hematology   Anesthesia Other Findings   Reproductive/Obstetrics                             Anesthesia Physical Anesthesia Plan  ASA: II  Anesthesia Plan: Epidural   Post-op Pain Management:    Induction:   Airway Management Planned: Natural Airway  Additional Equipment:   Intra-op Plan:   Post-operative Plan:   Informed Consent: I have reviewed the patients History and Physical, chart, labs and discussed the procedure including the risks, benefits and alternatives for the proposed anesthesia with the patient or authorized representative who has indicated his/her understanding and acceptance.     Plan Discussed with: CRNA and Surgeon  Anesthesia Plan Comments:         Anesthesia Quick Evaluation

## 2015-11-18 NOTE — Transfer of Care (Signed)
Immediate Anesthesia Transfer of Care Note  Patient: Eileen Carpenter  Procedure(s) Performed: Procedure(s): CESAREAN SECTION (N/A)  Patient Location: PACU  Anesthesia Type:Epidural  Level of Consciousness: awake, alert  and oriented  Airway & Oxygen Therapy: Patient Spontanous Breathing and Patient connected to nasal cannula oxygen  Post-op Assessment: Report given to RN and Post -op Vital signs reviewed and stable  Post vital signs: Reviewed and stable  Last Vitals:  Vitals:   11/18/15 1549 11/18/15 1703  BP: (!) 141/97 (!) 140/95  Pulse: (!) 129 (!) 149  Resp:    Temp:      Last Pain:  Vitals:   11/18/15 1430  TempSrc: Oral  PainSc:          Complications: No apparent anesthesia complications

## 2015-11-18 NOTE — Progress Notes (Signed)
Labor Check  Subj:  Complaints: Patient comfortable with epidural   Obj:  BP (!) 141/97   Pulse (!) 129   Temp 100 F (37.8 C) (Oral)   Resp 18   Ht 5\' 1"  (1.549 m)   Wt 75.4 kg (166 lb 3.2 oz)   LMP 03/02/2015 (Approximate)   SpO2 96%   BMI 31.40 kg/m  Dose (milli-units/min) Oxytocin: 0 milli-units/min  Cervix: Dilation: 10 / Effacement (%): 100 / Station: +1  Baseline FHR: 150    Variability: moderate    Accelerations: present    Decelerations: absent Contractions: present frequency: 4-5 q 10 min  A/P: 25 y.o. G1P0 female at [redacted]w[redacted]d with second stage arrest.  She has been pushing for right at 3 hours. I was in the room and personally assessed her last 30-45 minutes of pushing.  She has made no descensus and fetal caput is increasing. According to the bedside nurse, she has made no-to-very-little descensus for the past 1-1.5 hours.  Discussed that we could continue pushing for a while longer (15-30 minutes) or we could move to cesarean delivery.  The patient is exhausted and elects cesarean delivery.  The risks of the surgery and the details of the surgery were explained by me in detail.  All questions answered.  Discontinue Pitocin at this time.     FWB: reassuring, Overall assessment: category 1  GBS positive. Pre-op abx, per protocol (ancef/azithro)  Pain: epidural. Anesthesia aware  Thomasene Mohair, MD 11/18/2015 4:22 PM

## 2015-11-18 NOTE — Op Note (Signed)
Cesarean Section Operative Note    Eileen Carpenter   11/18/2015   Pre-operative Diagnosis:  1) intrauterine pregnancy at [redacted]w[redacted]d  2) Pre-eclampsia without severe features 3) second stage arrest.   Post-operative Diagnosis:  1) intrauterine pregnancy at [redacted]w[redacted]d  2) Pre-eclampsia without severe features 3) second stage arrest.   Procedure: Primary Low Transverse Cesarean Section via Pfannenstiel incision with double-layer uterine closure  Surgeon: Surgeon(s) and Role:    * Conard Novak, MD - Primary   Anesthesia: epidural   Findings:  normal appearing gravid uterus, fallopian tubes, and ovaries  Estimated Blood Loss: 750 mL  Total IV Fluids: 1,200 ml   Urine Output: 50 mL  Specimens: None  Complications: no complications  Disposition: PACU - hemodynamically stable.   Maternal Condition: stable   Baby condition / location:  Couplet care / Skin to Skin  Procedure Details:  The patient was seen in the Holding Room. The risks, benefits, complications, treatment options, and expected outcomes were discussed with the patient. The patient concurred with the proposed plan, giving informed consent. identified as Eileen Carpenter and the procedure verified as C-Section Delivery. A Time Out was held and the above information confirmed.   After induction of anesthesia, the patient was draped and prepped in the usual sterile manner, including an iodine vaginal prep. A Pfannenstiel incision was made and carried down through the subcutaneous tissue to the fascia. Fascial incision was made and extended transversely. The fascia was separated from the underlying rectus tissue superiorly and inferiorly. The peritoneum was identified and entered. Peritoneal incision was extended longitudinally. The bladder flap was sharply freed from the lower uterine segment. A low transverse uterine incision was made and the hysterotomy was extended with cranial-caudal tension. Delivered from cephalic presentation  was a 2,830 gram Living newborn infant(s) or Female with Apgar scores of 7 at one minute and 8 at five minutes. Cord ph was not sent the umbilical cord was clamped and cut cord blood was not obtained for evaluation. The placenta was removed Intact and appeared normal. The uterine outline, tubes and ovaries appeared normal. The uterine incision was closed with running locked sutures of 0 Vicryl.  A second layer of the same suture was thrown in an imbricating fashion.  Hemostasis was assured.  The uterus was returned to the abdomen and the paracolic gutters were cleared of all clots and debris.  The rectus muscles were inspected and found to be hemostatic.  The On-Q catheter pumps were inserted in accordance with the manufacturer's recommendations.  The catheters were inserted approximately 4cm cephelad to the incision line, approximately 1cm apart, straddling the midline.  They were inserted to a depth of the 4th mark. They were positioned superficial to the rectus abdominus muscles and deep to the rectus fascia.    The fascia was then reapproximated with running sutures of 1-0 PDS, looped. The subcutaneous tissue was reapproximated to reduce skin tension using 3 interrupted 3-0 vicryl sutures. The subcuticular closure was performed using 4-0 monocryl. The skin closure was reinforced using surgical skin glue.  The On-Q catheters were bolused with 5 mL of 0.5% marcaine plain for a total of 10 mL.  The catheters were affixed to the skin with surgical skin glue, steri-strips, and tegaderm.    Instrument, sponge, and needle counts were correct prior the abdominal closure and were correct at the conclusion of the case.  The patient received Ancef 2 gram and Azithromycin 500mg  IV prior to skin incision (within 30 minutes). For VTE  prophylaxis she was wearing SCDs throughout the case.   Signed: Conard Novak, MD 11/18/2015 6:29 PM

## 2015-11-18 NOTE — Anesthesia Procedure Notes (Signed)
Epidural Patient location during procedure: OB Start time: 11/18/2015 2:04 AM End time: 11/18/2015 2:11 AM  Staffing Anesthesiologist: Yves Dill Performed: anesthesiologist   Preanesthetic Checklist Completed: patient identified, site marked, surgical consent, pre-op evaluation, timeout performed, IV checked, risks and benefits discussed and monitors and equipment checked  Epidural Patient position: sitting Prep: Betadine and site prepped and draped Patient monitoring: heart rate, cardiac monitor, continuous pulse ox and blood pressure Approach: midline Location: L3-L4 Injection technique: LOR air  Needle:  Needle type: Tuohy  Needle gauge: 18 G Needle length: 9 cm Catheter size: 20 Guage Test dose: negative and 1.5% lidocaine with Epi 1:200 K  Assessment Sensory level: T8  Additional Notes Time out called.  Patient placed in sitting position.  Back prepped and draped in sterile fashion.  A skin wheal was made in the L3-L4 interspace with 1% Lidocaine plain.  An 18G Tuohy needle was guided into the epidural space by a loss of resistance technique.  3cc TD of 1.5% Lidocaine with epi 1: 200K was negative .  The epidural catheter was threaded 3 cm prior to TD.  The patient toolerated the procedure wello with no blood or paresthesias.  The catheter was affixed to the back in a sterile fashion.Reason for block:procedure for pain

## 2015-11-18 NOTE — Discharge Summary (Signed)
OB Discharge Summary     Patient Name: Eileen Carpenter DOB: Dec 31, 1990 MRN: [id number]  Date of admission: 11/16/2015 Delivering MD: Conard Novak, MD  Date of Delivery: 11/18/2015  Date of discharge: 11/21/2015  Admitting diagnosis: FOR INDUCTION failure to progress Intrauterine pregnancy: [redacted]w[redacted]d     Secondary diagnosis: Preeclampsia     Discharge diagnosis: Term Pregnancy Delivered and Preeclampsia (mild)                                                                                                Post partum procedures: TDAP  Augmentation: AROM and Pitocin  Complications: Failure to descend, elevated blood pressures postpartum  Hospital course:  Onset of Labor With Unplanned C/S  25 y.o. yo G1P1001 at [redacted]w[redacted]d was admitted for IOL on 11/16/2015. Patient had a labor course significant for induction with progression to 10cm.  She pushed for 3 hours with arrest of fetal descent.  She was taken to the OR due to progress of descent. Membrane Rupture Time/Date: 5:07 PM ,11/17/2015   The patient went for cesarean section due to Arrest of Descent, and delivered a Viable 6#3.8oz female infant,11/18/2015  Details of operation can be found in separate operative note. Patient had a postpartum course complicated by anemia and elevated blood pressures. Since she denied lightheadness and was hemodynamically stable, she was begun on iron and vitamin supplements.  Her blood pressures remained in the mild range postpartum, but patient denied persistent headaches, CP, SOB or visual changes. Repeat PIH labs were normal. and she was begun on labetalol on POD #2. By POD #3, she was ambulating,tolerating a regular diet, passing flatus, and urinating well.  Patient was discharged home in stable condition 11/21/2015 per her request. Will return in 2 days for a blood pressure check.   Physical exam  Vitals:   11/20/15 1905 11/20/15 2359 11/21/15 0329 11/21/15 0756  BP: (!) 143/97 (!) 132/94 (!) 128/92 (!) 135/93  Pulse:  (!) 106 91 96 92  Resp: 18 17 17 18   Temp: 99.2 F (37.3 C) 98.3 F (36.8 C) 98.1 F (36.7 C) 98.7 F (37.1 C)  TempSrc: Oral Oral Oral Oral  SpO2: 98% 98% 97% 98%  Weight:      Height:       General: alert, cooperative and no distress, appears pale Lochia: appropriate Uterine Fundus: firm at U-2/ML/NT Incision: Healing well with no significant drainage, honey comb dressing intact. ON Q intact DVT Evaluation: No evidence of DVT seen on physical exam. Heart: RRR without murmur Lungs: CTAB  Labs: Lab Results  Component Value Date   WBC 11.5 (H) 11/20/2015   HGB 8.2 (L) 11/20/2015   HCT 24.8 (L) 11/20/2015   MCV 83.2 11/20/2015   PLT 154 11/20/2015   CMP Latest Ref Rng & Units 11/20/2015  Glucose 65 - 99 mg/dL 917(H)  BUN 6 - 20 mg/dL 12  Creatinine 1.50 - 5.69 mg/dL 7.94(I)  Sodium 016 - 553 mmol/L 136  Potassium 3.5 - 5.1 mmol/L 3.8  Chloride 101 - 111 mmol/L 107  CO2 22 - 32 mmol/L 23  Calcium 8.9 - 10.3  mg/dL 1.6(X)  Total Protein 6.5 - 8.1 g/dL 0.9(U)  Total Bilirubin 0.3 - 1.2 mg/dL 0.3  Alkaline Phos 38 - 126 U/L 161(H)  AST 15 - 41 U/L 34  ALT 14 - 54 U/L 17    Discharge instruction: per After Visit Summary.  Medications:    Medication List    TAKE these medications   FUSION PLUS Caps Take 1 capsule by mouth daily.   HYDROcodone-acetaminophen 5-325 MG tablet Commonly known as:  NORCO/VICODIN Take 1-2 tablets by mouth every 6 (six) hours as needed for moderate pain.   labetalol 100 MG tablet Commonly known as:  NORMODYNE Take 1 tablet (100 mg total) by mouth 2 (two) times daily.   naproxen sodium 550 MG tablet Commonly known as:  ANAPROX DS Take 1 tablet (550 mg total) by mouth 2 (two) times daily as needed for mild pain or moderate pain.   PRENATE MINI 18-0.6-0.4-350 MG Caps Take 1 capsule by mouth at bedtime.       Diet: routine diet  Activity: Advance as tolerated. Pelvic rest and no heavy lifting for 6 weeks. No driving for 2-3  weeks  Outpatient follow up: Follow-up Information    Conard Novak, MD Follow up in 1 week(s).   Specialty:  Obstetrics and Gynecology Why:  incision check, pp depression check Contact information: [address] [place] [address] [phone]        Farrel Conners, CNM. Go in 2 day(s).   Specialty:  Certified Nurse Midwife Why:  for blood pressure check Contact information: [address] [place] [address] [phone]             Postpartum contraception: desires OCPs Rhogam Given postpartum: no Rubella vaccine given postpartum: no Varicella vaccine given postpartum: no TDaP given antepartum or postpartum: pp  Newborn Data: Live born female  Birth Weight: 6 lb 3.8 oz (2830 g) APGAR: 7, 8   Baby Feeding: Bottle  Disposition:home with mother  SIGNED: Farrel Conners, CNM

## 2015-11-19 LAB — CBC
HEMATOCRIT: 25.9 % — AB (ref 35.0–47.0)
Hemoglobin: 8.8 g/dL — ABNORMAL LOW (ref 12.0–16.0)
MCH: 27.6 pg (ref 26.0–34.0)
MCHC: 33.9 g/dL (ref 32.0–36.0)
MCV: 81.3 fL (ref 80.0–100.0)
PLATELETS: 126 10*3/uL — AB (ref 150–440)
RBC: 3.19 MIL/uL — ABNORMAL LOW (ref 3.80–5.20)
RDW: 15.1 % — AB (ref 11.5–14.5)
WBC: 15.7 10*3/uL — ABNORMAL HIGH (ref 3.6–11.0)

## 2015-11-19 MED ORDER — HYDROCODONE-ACETAMINOPHEN 5-325 MG PO TABS
1.0000 | ORAL_TABLET | ORAL | Status: DC | PRN
  Administered 2015-11-19 – 2015-11-21 (×7): 2 via ORAL
  Administered 2015-11-21: 1 via ORAL
  Filled 2015-11-19 (×7): qty 2

## 2015-11-19 MED ORDER — HYDROCODONE-ACETAMINOPHEN 5-325 MG PO TABS
1.0000 | ORAL_TABLET | ORAL | Status: AC | PRN
  Administered 2015-11-19 (×2): 2 via ORAL
  Filled 2015-11-19 (×3): qty 2

## 2015-11-19 MED ORDER — MORPHINE SULFATE (PF) 2 MG/ML IV SOLN: 0.5000 mg | INTRAVENOUS | Status: AC | PRN

## 2015-11-19 NOTE — Progress Notes (Signed)
Patient ID: Astrid DraftsShiann Penick, female   DOB: 08/05/1990, 25 y.o.   MRN: [id number] Obstetric Postpartum/PostOperative Daily Progress Note Subjective:  25 y.o. G1P1001 POD#1 status post cesarean delivery.  She is not ambulating, is tolerating po, is not voiding spontaneously due to her catheter still being in place.  Her pain is well controlled on PO pain medications. Her lochia is less than menses.   Medications SCHEDULED MEDICATIONS  . ferrous sulfate  325 mg Oral BID WC  . ketorolac  30 mg Intravenous Q6H  . prenatal multivitamin  1 tablet Oral Q1200  . senna-docusate  2 tablet Oral Q24H  . simethicone  80 mg Oral TID PC    MEDICATION INFUSIONS  . bupivacaine 0.25 % ON-Q pump DUAL CATH 400 mL    . bupivacaine ON-Q pain pump    . lactated ringers      PRN MEDICATIONS  coconut oil, witch hazel-glycerin **AND** dibucaine, diphenhydrAMINE, menthol-cetylpyridinium, oxyCODONE-acetaminophen **OR** oxyCODONE-acetaminophen, oxyCODONE-acetaminophen    Objective:   Vitals:   11/19/15 0414 11/19/15 0621 11/19/15 0748 11/19/15 0951  BP: (!) 124/92 123/89 123/70 (!) 134/92  Pulse: (!) 111 (!) 102 (!) 104 (!) 114  Resp: 20  18   Temp: 98.7 F (37.1 C)  97.8 F (36.6 C)   TempSrc: Oral  Oral   SpO2: 98%  98% 98%  Weight:      Height:        Current Vital Signs 24h Vital Sign Ranges  T 97.8 F (36.6 C) Temp  Avg: 99 F (37.2 C)  Min: 97.8 F (36.6 C)  Max: 100 F (37.8 C)  BP (!) 134/92 BP  Min: 123/70  Max: 155/100  HR (!) 114 Pulse  Avg: 116.5  Min: 102  Max: 149  RR 18 Resp  Avg: 18.6  Min: 16  Max: 20  SaO2 98 % Not Delivered SpO2  Avg: 97.9 %  Min: 97 %  Max: 99 %       24 Hour I/O Current Shift I/O  Time Ins Outs 08/05 0701 - 08/06 0700 In: 500 [I.V.:500] Out: 1625 [Urine:875] No intake/output data recorded.  General: NAD Pulmonary: no increased work of breathing Abdomen: non-distended, non-tender, fundus firm at level of umbilicus Inc: Clean/dry/intact, honey-comb dressing  in place and appears dry Extremities: no edema, no erythema, no tenderness  Labs:   Recent Labs Lab 11/13/15 1741 11/16/15 2118 11/17/15 1809 11/19/15 0546  WBC 11.6* 11.6*  --  15.7*  HGB 11.0* 10.6*  --  8.8*  HCT 32.9* 31.5*  --  25.9*  PLT 155 156 149* 126*     Assessment:   24 y.o. G1P1001 postoperative day # 1, s/p primary cesarean section for second stage arrest, doing well.  She also has preeclampsia without severe features.  Plan:  1) Acute blood loss anemia - hemodynamically stable and asymptomatic - po ferrous sulfate  2) A POS / Rubella Immune (02/08 0000)/ Varicella Immune  3) Formula feeding /Contraception = oral contraceptives (estrogen/progesterone)  4) Preeclampsia without severe features: continue to monitor for symptoms and blood pressures.  5) Disposition: home POD #3.  Conard NovakStephen D. Janalyn Higby, MD, FACOG 11/19/2015 11:36 AM

## 2015-11-19 NOTE — Progress Notes (Signed)
Notify LurayJennifer, Rn of vs

## 2015-11-20 ENCOUNTER — Encounter: Payer: Self-pay | Admitting: Obstetrics and Gynecology

## 2015-11-20 LAB — COMPREHENSIVE METABOLIC PANEL
ALK PHOS: 161 U/L — AB (ref 38–126)
ALT: 17 U/L (ref 14–54)
ANION GAP: 6 (ref 5–15)
AST: 34 U/L (ref 15–41)
Albumin: 2.1 g/dL — ABNORMAL LOW (ref 3.5–5.0)
BUN: 12 mg/dL (ref 6–20)
CALCIUM: 8.5 mg/dL — AB (ref 8.9–10.3)
CHLORIDE: 107 mmol/L (ref 101–111)
CO2: 23 mmol/L (ref 22–32)
Creatinine, Ser: 0.38 mg/dL — ABNORMAL LOW (ref 0.44–1.00)
GFR calc non Af Amer: >60 mL/min (ref >60)
Glucose, Bld: 100 mg/dL — ABNORMAL HIGH (ref 65–99)
POTASSIUM: 3.8 mmol/L (ref 3.5–5.1)
SODIUM: 136 mmol/L (ref 135–145)
Total Bilirubin: 0.3 mg/dL (ref 0.3–1.2)
Total Protein: 5.4 g/dL — ABNORMAL LOW (ref 6.5–8.1)

## 2015-11-20 LAB — CBC
HCT: 24.8 % — ABNORMAL LOW (ref 35.0–47.0)
HEMOGLOBIN: 8.2 g/dL — AB (ref 12.0–16.0)
MCH: 27.6 pg (ref 26.0–34.0)
MCHC: 33.2 g/dL (ref 32.0–36.0)
MCV: 83.2 fL (ref 80.0–100.0)
Platelets: 154 10*3/uL (ref 150–440)
RBC: 2.98 MIL/uL — AB (ref 3.80–5.20)
RDW: 15.2 % — ABNORMAL HIGH (ref 11.5–14.5)
WBC: 11.5 10*3/uL — ABNORMAL HIGH (ref 3.6–11.0)

## 2015-11-20 MED ORDER — LABETALOL HCL 100 MG PO TABS
100.0000 mg | ORAL_TABLET | Freq: Two times a day (BID) | ORAL | Status: DC
  Administered 2015-11-20 – 2015-11-21 (×2): 100 mg via ORAL
  Filled 2015-11-20 (×2): qty 1

## 2015-11-20 MED ORDER — TETANUS-DIPHTH-ACELL PERTUSSIS 5-2.5-18.5 LF-MCG/0.5 IM SUSP: 0.5000 mL | INTRAMUSCULAR | Status: DC | PRN

## 2015-11-20 NOTE — Anesthesia Postprocedure Evaluation (Signed)
Anesthesia Post Note  Patient: Eileen Carpenter  Procedure(s) Performed: Procedure(s) (LRB): CESAREAN SECTION (N/A)  Patient location during evaluation: Mother Baby Anesthesia Type: Epidural Level of consciousness: awake, awake and alert and oriented Pain management: pain level controlled Vital Signs Assessment: post-procedure vital signs reviewed and stable Respiratory status: spontaneous breathing, nonlabored ventilation and respiratory function stable Cardiovascular status: stable Postop Assessment: no backache Anesthetic complications: no Comments: Site is clean, dry, and intact. Patient voiced no complaints.     Last Vitals:  Vitals:   11/19/15 2246 11/20/15 0307  BP: 129/84 133/82  Pulse: (!) 116 (!) 112  Resp: 18 18  Temp: 37 C 36.8 C    Last Pain:  Vitals:   11/20/15 0651  TempSrc:   PainSc: 5                  Microsofthuy Islam Eichinger

## 2015-11-20 NOTE — Progress Notes (Signed)
   11/20/15 0900  Clinical Encounter Type  Visited With Patient  Visit Type Initial  Consult/Referral To Chaplain  Pt is in good spirits.

## 2015-11-20 NOTE — Progress Notes (Signed)
Postpartum Progress Note: s/p CS following FTD-POD #2. IOL for preeclampsia   CTSP due to having an episode of feeling hot and having a biparietal headache after up and ambulating. No headache after resting and eating. Denies lightheadedness, CP, SOB. Bottle feeding. Baby doing well.  O: General: appears pale, in NAD Patient Vitals for the past 24 hrs:  BP Temp Temp src Pulse Resp SpO2  11/20/15 1905 (!) 143/97 99.2 F (37.3 C) Oral (!) 106 18 98 %  11/20/15 1730 (!) 140/98 - - - - -  11/20/15 1650 (!) 136/95 97.8 F (36.6 C) Oral (!) 109 18 98 %  11/20/15 0900 (!) 142/87 98.7 F (37.1 C) Oral (!) 110 20 -  11/20/15 0753 (!) 130/96 98.4 F (36.9 C) Oral (!) 103 18 95 %  11/20/15 0307 133/82 98.2 F (36.8 C) Oral (!) 112 18 -  11/19/15 2246 129/84 98.6 F (37 C) Oral (!) 116 18 98 %   Heart: mild tachycardia without murmur Lungs: CTAB Ext: +2 DTRs/ +1 LE edema Results for orders placed or performed during the hospital encounter of 11/16/15 (from the past 24 hour(s))  Comprehensive metabolic panel     Status: Abnormal   Collection Time: 11/20/15  6:34 PM  Result Value Ref Range   Sodium 136 135 - 145 mmol/L   Potassium 3.8 3.5 - 5.1 mmol/L   Chloride 107 101 - 111 mmol/L   CO2 23 22 - 32 mmol/L   Glucose, Bld 100 (H) 65 - 99 mg/dL   BUN 12 6 - 20 mg/dL   Creatinine, Ser 1.610.38 (L) 0.44 - 1.00 mg/dL   Calcium 8.5 (L) 8.9 - 10.3 mg/dL   Total Protein 5.4 (L) 6.5 - 8.1 g/dL   Albumin 2.1 (L) 3.5 - 5.0 g/dL   AST 34 15 - 41 U/L   ALT 17 14 - 54 U/L   Alkaline Phosphatase 161 (H) 38 - 126 U/L   Total Bilirubin 0.3 0.3 - 1.2 mg/dL   GFR calc non Af Amer >60 >60 mL/min   GFR calc Af Amer >60 >60 mL/min   Anion gap 6 5 - 15  CBC     Status: Abnormal   Collection Time: 11/20/15  6:34 PM  Result Value Ref Range   WBC 11.5 (H) 3.6 - 11.0 K/uL   RBC 2.98 (L) 3.80 - 5.20 MIL/uL   Hemoglobin 8.2 (L) 12.0 - 16.0 g/dL   HCT 09.624.8 (L) 04.535.0 - 40.947.0 %   MCV 83.2 80.0 - 100.0 fL   MCH  27.6 26.0 - 34.0 pg   MCHC 33.2 32.0 - 36.0 g/dL   RDW 81.115.2 (H) 91.411.5 - 78.214.5 %   Platelets 154 150 - 440 K/uL   A: POD #2-mild range blood pressures with normal PIH labs.  Acute blood loss anemia: not lightheaded  P: Start labetalol 100 mgm BID Safety precautions regarding anemia explained Discussed being on iron and vitamin supplements/ decreased energy level Continue q4hour vital signs.  Farrel ConnersGUTIERREZ, Keymoni Mccaster, CNM

## 2015-11-20 NOTE — Progress Notes (Signed)
  Post-operative Day 2  Subjective: up ad lib, voiding, tolerating PO and + flatus  Objective: Blood pressure (!) 130/96, pulse (!) 103, temperature 98.4 F (36.9 C), temperature source Oral, resp. rate 18, height 5\' 1"  (1.549 m), weight 166 lb 3.2 oz (75.4 kg), last menstrual period 03/02/2015, SpO2 95 %   Physical Exam:  General: alert and cooperative Lochia: appropriate Uterine Fundus: firm Incision: healing well, no significant drainage DVT Evaluation: No evidence of DVT seen on physical exam. Abdomen: soft, NT   Recent Labs  11/19/15 0546  HGB 8.8*  HCT 25.9*  Admission HCT: 31.5  Assessment POD #2, acute blood loss anemia  Plan: Continue PO care, Advance activity as tolerated and Fe replacement, anemia precautions  Feeding: bottle Contraception: OCP Blood Type: O+ RI/VI TDAP ordered prior to discharge    Eileen Carpenter, Eileen Carpenter, [place]CNM 11/20/2015, 10:28 AM

## 2015-11-21 MED ORDER — NAPROXEN SODIUM 550 MG PO TABS: 550.0000 mg | ORAL_TABLET | Freq: Two times a day (BID) | ORAL | 0 refills | Status: AC | PRN

## 2015-11-21 MED ORDER — LABETALOL HCL 100 MG PO TABS: 100.0000 mg | ORAL_TABLET | Freq: Two times a day (BID) | ORAL | 0 refills | Status: AC

## 2015-11-21 MED ORDER — HYDROCODONE-ACETAMINOPHEN 5-325 MG PO TABS: 1.0000 | ORAL_TABLET | Freq: Four times a day (QID) | ORAL | 0 refills | Status: AC | PRN

## 2015-11-21 MED ORDER — FUSION PLUS PO CAPS: 1.0000 | ORAL_CAPSULE | Freq: Every day | ORAL | 1 refills | Status: AC

## 2015-11-21 NOTE — Discharge Instructions (Signed)
Please call your doctor or return to the ER if you experience any chest pains, shortness of breath, fever greater than 101, any heavy bleeding or large clots, and foul smelling vaginal discharge, any worsening abdominal pain & cramping that is not controlled by pain medication, or any signs of post partum depression.  Please check your incision everyday for redness, swelling, or drainage.  No tampons, enemas, douches, or sexual intercourse for 6 weeks.  Also avoid tub baths, hot tubs, or swimming for 6 weeks.    Cesarean Delivery, Care After Refer to this sheet in the next few weeks. These instructions provide you with information on caring for yourself after your procedure. Your health care provider may also give you specific instructions. Your treatment has been planned according to current medical practices, but problems sometimes occur. Call your health care provider if you have any problems or questions after you go home. HOME CARE INSTRUCTIONS  If you have an On-Q pump, remove it on the 4th day after your surgery, by removing the dressing/bandage and pulling the pump out. Cover the site where the pump strings came out with a band-aid, as needed.  Only take over-the-counter or prescription medications as directed by your health care provider.  Do not drink alcohol, especially if you are breastfeeding or taking medication to relieve pain.  Do not  smoke tobacco.  Continue to use good perineal care. Good perineal care includes:  Wiping your perineum from front to back.  Keeping your perineum clean.  Check your surgical cut (incision) daily for increased redness, drainage, swelling, or separation of skin.  Can shower daily.   Hug a pillow when coughing or sneezing until your incision is healed. This helps to relieve pain.  Do not use tampons, douches or have sexual intercourse for 6 weeks.   Wear a well-fitting bra that provides breast support. Use cabbage leaves if breasts become  engorged.  Limit wearing support panties or control-top hose.  Drink enough fluids to keep your urine clear or pale yellow.  Eat high-fiber foods such as whole grain cereals and breads, brown rice, beans, and fresh fruits and vegetables every day. These foods may help prevent or relieve constipation.  No driving x 2-3 weeks (can not take Norco and be driving)  Try to have someone help you with your household activities and your newborn for at least a few days after you leave the hospital.  Rest as much as possible. Try to rest or take a nap when your newborn is sleeping.  Increase your activities gradually.  Do not lift more than 15lbs until directed by a provider for 6 weeks  Keep all of your scheduled postpartum appointments. It is very important to keep your scheduled follow-up appointments. At these appointments, your health care provider will be checking to make sure that you are healing physically and emotionally. SEEK MEDICAL CARE IF:   You are passing large clots from your vagina. Save any clots to show your health care provider.  You have a foul smelling discharge from your vagina.  You have trouble urinating.  You are urinating frequently.  You have pain when you urinate.  You have a change in your bowel movements.  You have increasing redness, pain, or swelling near your incision.  You have pus draining from your incision.  Your incision is separating.  You have painful, hard, or reddened breasts.  You have a severe headache.  You have blurred vision or see spots.  You feel sad  or depressed.  You have thoughts of hurting yourself or your newborn.  You have questions about your care, the care of your newborn, or medications.  You are dizzy or light-headed.  You have a rash.  You have pain, redness, or swelling at the site of the removed intravenous access (IV) tube.  You have nausea or vomiting.  You stopped breastfeeding and have not had a  menstrual period within 12 weeks of stopping.  You are not breastfeeding and have not had a menstrual period within 12 weeks of delivery.  You have a fever. SEEK IMMEDIATE MEDICAL CARE IF:  You have persistent pain.  You have chest pain.  You have shortness of breath.  You faint.  You have leg pain.  You have stomach pain.  Your vaginal bleeding saturates 2 or more sanitary pads in 1 hour. MAKE SURE YOU:   Understand these instructions.  Will watch your condition.  Will get help right away if you are not doing well or get worse. Document Released: 12/22/2001 Document Revised: 08/16/2013 Document Reviewed: 11/27/2011 [facility] Patient Information 2015 [place], [place]. This information is not intended to replace advice given to you by your health care provider. Make sure you discuss any questions you have with your health care provider.

## 2015-11-21 NOTE — Progress Notes (Signed)
D/C order from MD.  Reviewed d/c instructions and prescriptions with patient and answered any questions.  Patient d/c home with infant via wheelchair by nursing/auxillary. 

## 2016-01-30 IMAGING — US US OB COMP LESS 14 WK
1 series · 14 of 28 positions shown · non-contrast
Comparison: None.

CLINICAL DATA: 24-year-old pregnant female with vaginal bleeding
and passing blood clot

EXAM:
OBSTETRIC <14 WK US AND TRANSVAGINAL OB US
TECHNIQUE: Both transabdominal and transvaginal ultrasound examinations were
performed for complete evaluation of the gestation as well as the
maternal uterus, adnexal regions, and pelvic cul-de-sac.
Transvaginal technique was performed to assess early pregnancy.

[Series 1: us ob comp less 14 wk · 0.08mm/px · 14 of 89 slices shown]
[im 4/89]
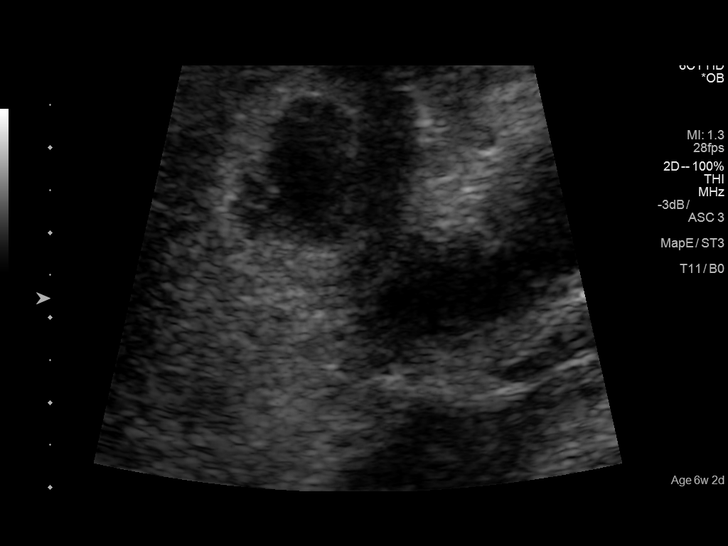
[im 10/89]
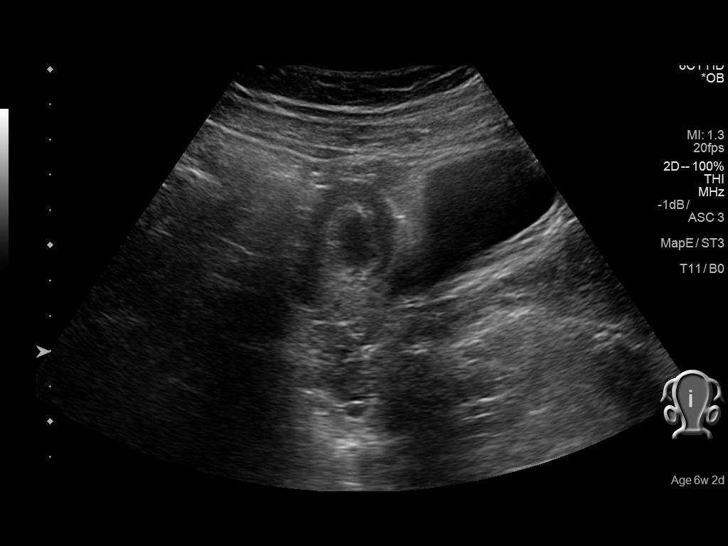
[im 17/89]
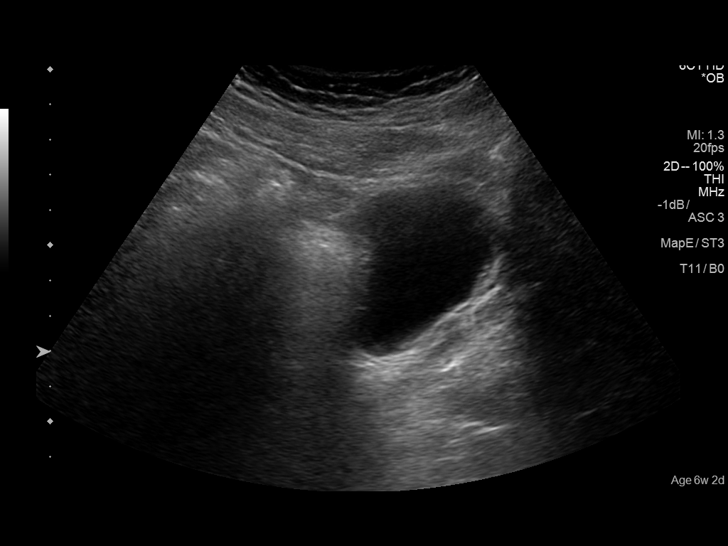
[im 23/89]
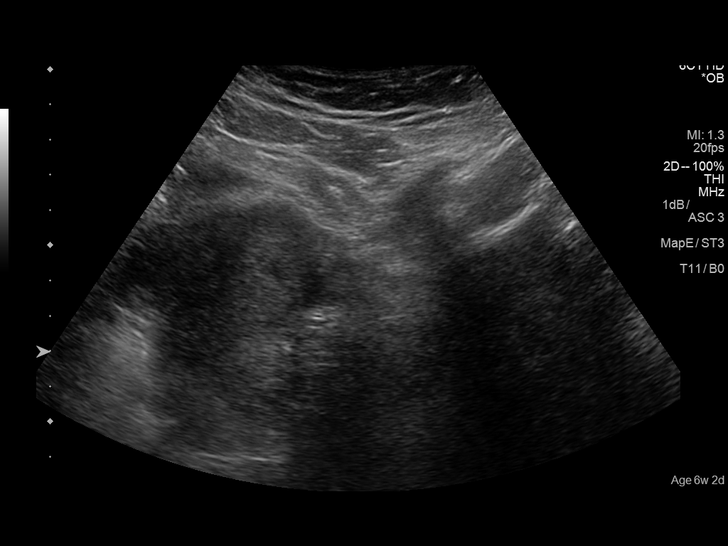
[im 30/89]
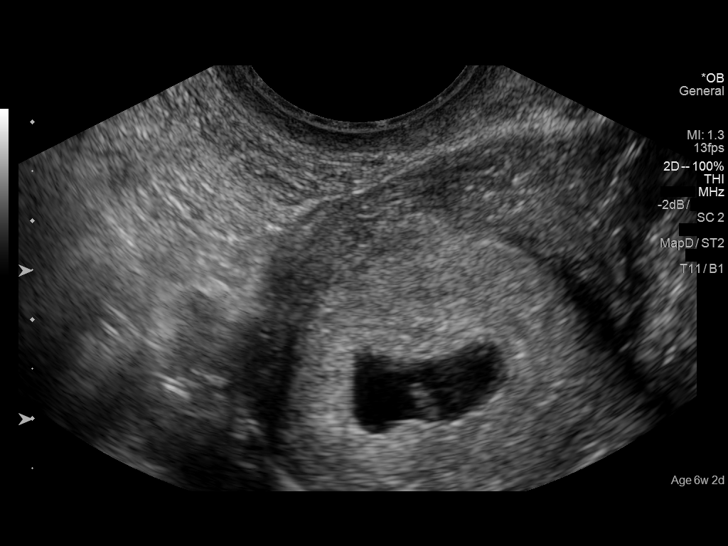
[im 36/89]
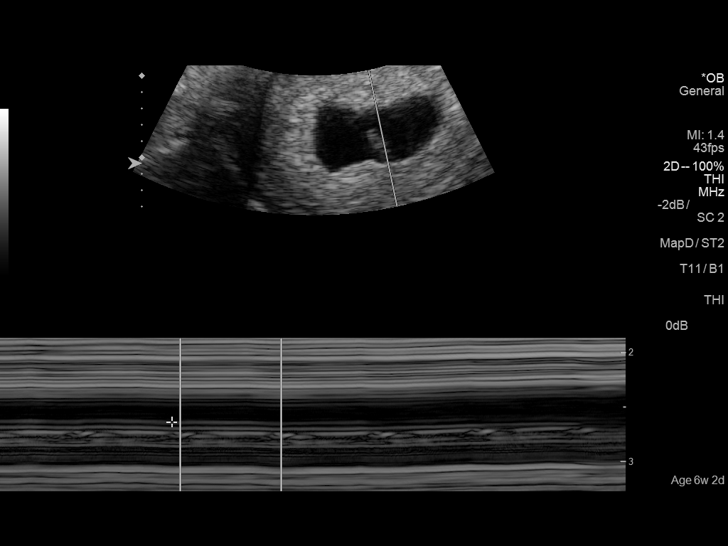
[im 43/89]
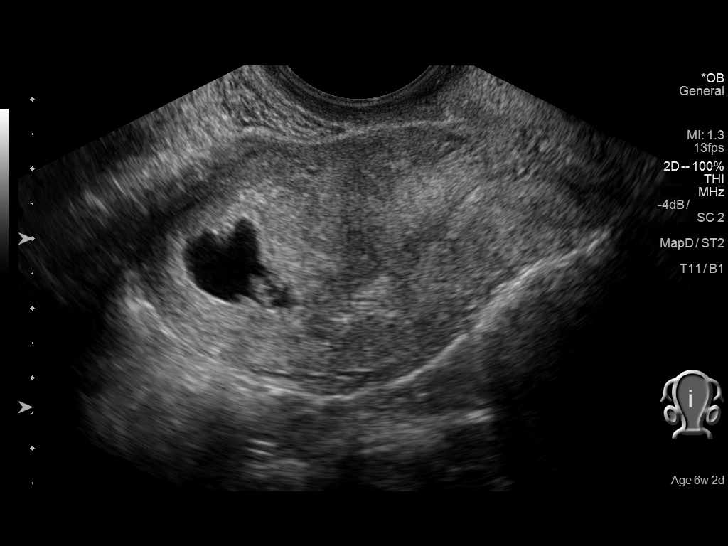
[im 49/89]
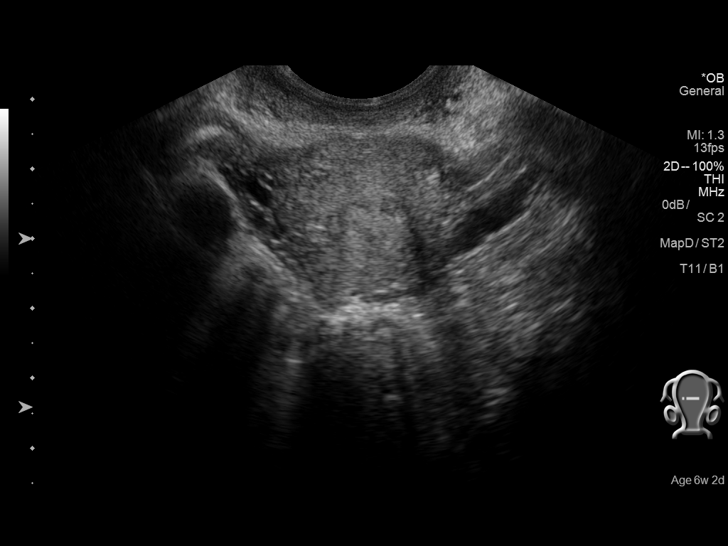
[im 56/89]
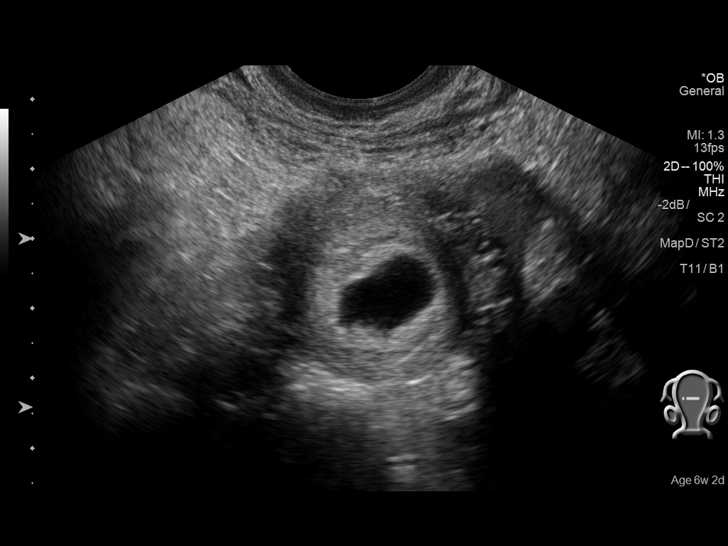
[im 62/89]
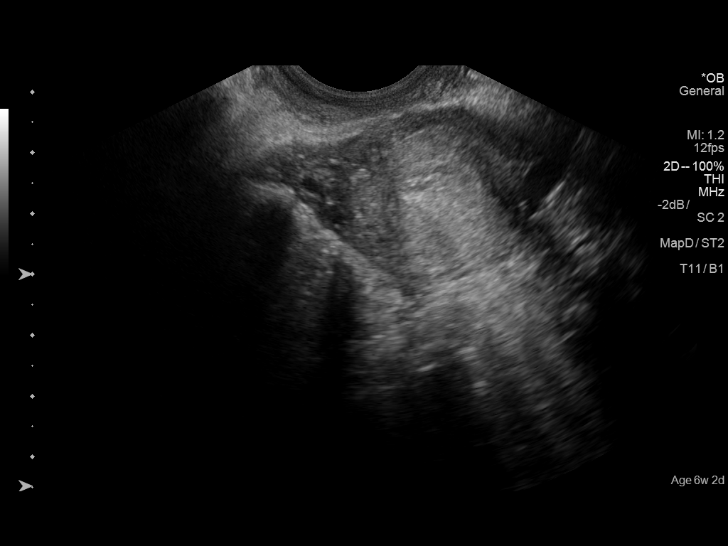
[im 69/89]
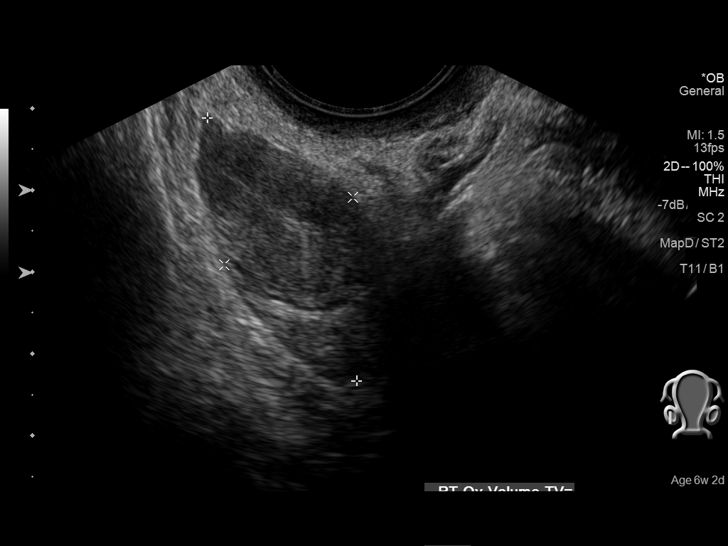
[im 75/89]
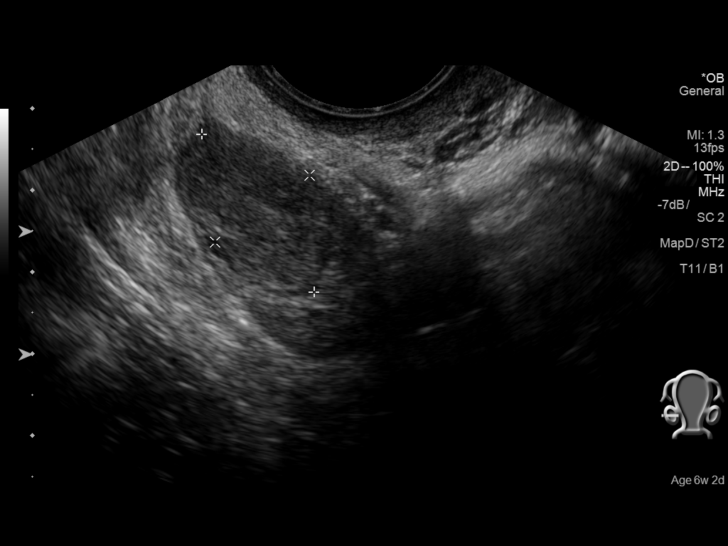
[im 82/89]
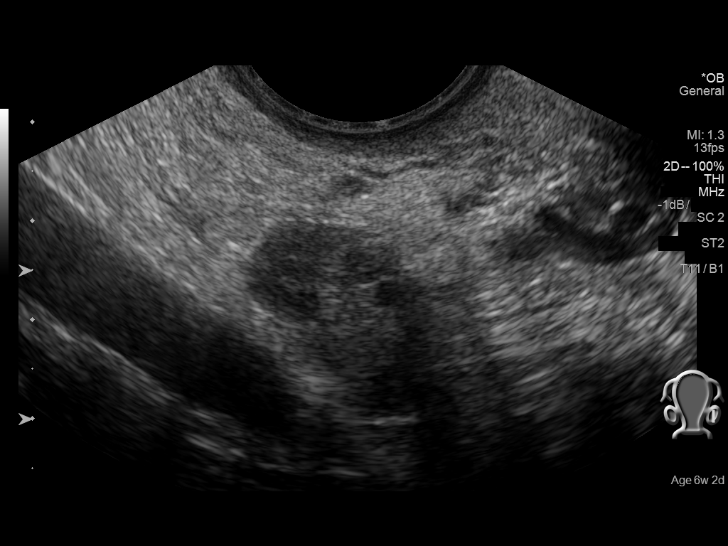
[im 89/89]
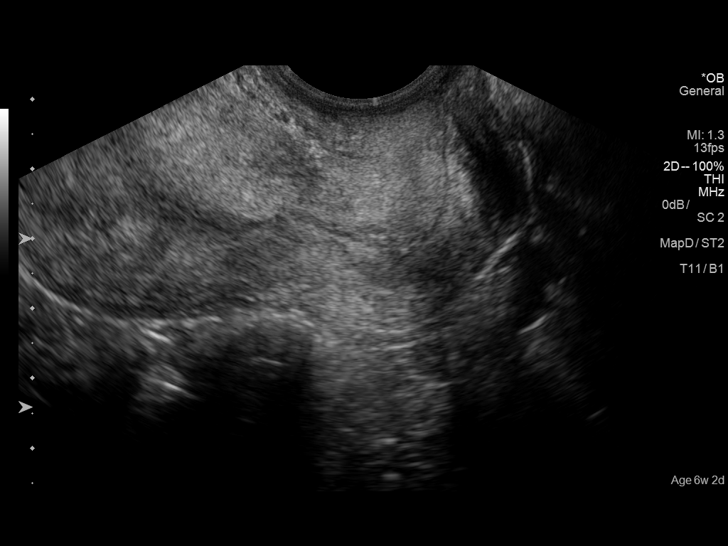

[14 of 28 positions shown; findings below may reference images not displayed]

FINDINGS: Intrauterine gestational sac: Slightly irregular appearing
intrauterine gestational sac

Yolk sac:  Seen

Embryo:  Present

Cardiac Activity: Detected

Heart Rate: 101  bpm

CRL:  4  mm   6 w   1 d                  US EDC: 12/08/2015

Subchorionic hemorrhage:  None visualized.

Maternal uterus/adnexae: The maternal ovaries appear unremarkable.
Probable right ovarian corpus luteum.

Trace free fluid within the pelvis.
IMPRESSION: Single live intrauterine pregnancy with an estimated gestational age
of 6 weeks 1 day.
